# Patient Record
Sex: Female | Born: 1990
Health system: Southern US, Community
[De-identification: ages and names within clinical notes are randomized; demographics above are authoritative.]

## PROBLEM LIST (undated history)

## (undated) DIAGNOSIS — F32A Depression, unspecified: Secondary | ICD-10-CM

## (undated) DIAGNOSIS — F419 Anxiety disorder, unspecified: Secondary | ICD-10-CM

## (undated) DIAGNOSIS — F329 Major depressive disorder, single episode, unspecified: Secondary | ICD-10-CM

## (undated) DIAGNOSIS — J45909 Unspecified asthma, uncomplicated: Secondary | ICD-10-CM

## (undated) DIAGNOSIS — K219 Gastro-esophageal reflux disease without esophagitis: Secondary | ICD-10-CM

## (undated) HISTORY — DX: Depression, unspecified: F32.A

## (undated) HISTORY — DX: Anxiety disorder, unspecified: F41.9

## (undated) HISTORY — PX: NO PAST SURGERIES: SHX2092

## (undated) HISTORY — DX: Major depressive disorder, single episode, unspecified: F32.9

---

## 2012-04-18 ENCOUNTER — Encounter (HOSPITAL_COMMUNITY): Payer: Self-pay | Admitting: Emergency Medicine

## 2012-04-18 ENCOUNTER — Inpatient Hospital Stay (HOSPITAL_COMMUNITY)
Admission: EM | Admit: 2012-04-18 | Discharge: 2012-04-24 | DRG: 204 | Disposition: A | Discharge status: Home / Self Care | Payer: BC Managed Care – PPO | Attending: Internal Medicine | Admitting: Internal Medicine

## 2012-04-18 ENCOUNTER — Emergency Department (HOSPITAL_COMMUNITY): Payer: BC Managed Care – PPO

## 2012-04-18 DIAGNOSIS — K5900 Constipation, unspecified: Secondary | ICD-10-CM

## 2012-04-18 DIAGNOSIS — J45909 Unspecified asthma, uncomplicated: Secondary | ICD-10-CM

## 2012-04-18 DIAGNOSIS — M549 Dorsalgia, unspecified: Secondary | ICD-10-CM

## 2012-04-18 DIAGNOSIS — K219 Gastro-esophageal reflux disease without esophagitis: Secondary | ICD-10-CM

## 2012-04-18 DIAGNOSIS — R1013 Epigastric pain: Secondary | ICD-10-CM

## 2012-04-18 DIAGNOSIS — K859 Acute pancreatitis without necrosis or infection, unspecified: Principal | ICD-10-CM

## 2012-04-18 HISTORY — DX: Gastro-esophageal reflux disease without esophagitis: K21.9

## 2012-04-18 HISTORY — DX: Unspecified asthma, uncomplicated: J45.909

## 2012-04-18 LAB — CBC WITH DIFFERENTIAL/PLATELET
Eosinophils Relative: 0 % (ref 0–5)
HCT: 36.6 % (ref 36.0–46.0)
Hemoglobin: 12.2 g/dL (ref 12.0–15.0)
Lymphocytes Relative: 16 % (ref 12–46)
Lymphs Abs: 1.2 10*3/uL (ref 0.7–4.0)
MCV: 79.6 fL (ref 78.0–100.0)
Monocytes Absolute: 0.4 10*3/uL (ref 0.1–1.0)
Monocytes Relative: 6 % (ref 3–12)
RBC: 4.6 MIL/uL (ref 3.87–5.11)
RDW: 13 % (ref 11.5–15.5)
WBC: 7.5 10*3/uL (ref 4.0–10.5)

## 2012-04-18 LAB — URINALYSIS, MICROSCOPIC ONLY
Nitrite: NEGATIVE
Protein, ur: NEGATIVE mg/dL
Specific Gravity, Urine: 1.026 (ref 1.005–1.030)
Urobilinogen, UA: 1 mg/dL (ref 0.0–1.0)

## 2012-04-18 LAB — COMPREHENSIVE METABOLIC PANEL
BUN: 8 mg/dL (ref 6–23)
CO2: 26 mEq/L (ref 19–32)
Calcium: 10 mg/dL (ref 8.4–10.5)
Creatinine, Ser: 1.03 mg/dL (ref 0.50–1.10)
GFR calc Af Amer: >90 mL/min (ref >90)
GFR calc non Af Amer: 78 mL/min — ABNORMAL LOW (ref >90)
Glucose, Bld: 98 mg/dL (ref 70–99)
Total Bilirubin: 0.5 mg/dL (ref 0.3–1.2)

## 2012-04-18 MED ORDER — MORPHINE SULFATE 4 MG/ML IJ SOLN
4.0000 mg | Freq: Once | INTRAMUSCULAR | Status: AC
  Administered 2012-04-18: 4 mg via INTRAVENOUS
  Filled 2012-04-18: qty 1

## 2012-04-18 MED ORDER — ONDANSETRON HCL 4 MG/2ML IJ SOLN
4.0000 mg | Freq: Three times a day (TID) | INTRAMUSCULAR | Status: DC | PRN
  Administered 2012-04-19: 4 mg via INTRAVENOUS
  Filled 2012-04-18: qty 2

## 2012-04-18 MED ORDER — IOHEXOL 300 MG/ML  SOLN
100.0000 mL | Freq: Once | INTRAMUSCULAR | Status: AC | PRN
  Administered 2012-04-18: 100 mL via INTRAVENOUS

## 2012-04-18 MED ORDER — HYDROMORPHONE HCL PF 1 MG/ML IJ SOLN
1.0000 mg | INTRAMUSCULAR | Status: DC | PRN
  Administered 2012-04-19: 1 mg via INTRAVENOUS
  Filled 2012-04-18: qty 1

## 2012-04-18 MED ORDER — SODIUM CHLORIDE 0.9 % IV BOLUS (SEPSIS)
1000.0000 mL | Freq: Once | INTRAVENOUS | Status: AC
  Administered 2012-04-18: 1000 mL via INTRAVENOUS

## 2012-04-18 MED ORDER — IOHEXOL 300 MG/ML  SOLN
50.0000 mL | Freq: Once | INTRAMUSCULAR | Status: AC | PRN
  Administered 2012-04-18: 50 mL via ORAL

## 2012-04-18 MED ORDER — FENTANYL CITRATE 0.05 MG/ML IJ SOLN
50.0000 ug | Freq: Once | INTRAMUSCULAR | Status: AC
  Administered 2012-04-18: 50 ug via INTRAVENOUS
  Filled 2012-04-18: qty 2

## 2012-04-18 MED ORDER — ONDANSETRON HCL 4 MG/2ML IJ SOLN
4.0000 mg | Freq: Once | INTRAMUSCULAR | Status: AC
  Administered 2012-04-18: 4 mg via INTRAVENOUS
  Filled 2012-04-18: qty 2

## 2012-04-18 MED ORDER — DEXTROSE-NACL 5-0.45 % IV SOLN
INTRAVENOUS | Status: AC
  Administered 2012-04-18: via INTRAVENOUS

## 2012-04-18 MED ORDER — HYDROMORPHONE HCL PF 1 MG/ML IJ SOLN
1.0000 mg | Freq: Once | INTRAMUSCULAR | Status: AC
  Administered 2012-04-18: 1 mg via INTRAVENOUS
  Filled 2012-04-18: qty 1

## 2012-04-18 NOTE — ED Notes (Signed)
Patient transported to CT 

## 2012-04-18 NOTE — Progress Notes (Signed)
[facility] ED CM noted pt with coverage but no pcp listed Spoke with pt who confirms no pcp  [facility] ED CM spoke with pt on how to obtain an in network pcp with insurance coverage via the customer service number or web site   

## 2012-04-18 NOTE — ED Notes (Signed)
Pt on hold for admit to floor pending RN arrival.  AC/Charge RN aware

## 2012-04-18 NOTE — Progress Notes (Signed)
[facility] ED CM spoke with Cala Bradford Pt's mother to inform her of pt request for staff to call her Informed her EDP further decisions is based on results of pt test, pt has received pain medication Mother wants CM to have EDP or RN to "keep her posted" and to let her know if pt is being admitted so that she could travel from [place] to be with pt. CM updated ED RN and EDP

## 2012-04-18 NOTE — Progress Notes (Signed)
ED CM noted pt without a pcp listed with blue cross and blue shield coverage Spoke with pt She was noted to be in distress, tearful and stating "hurting too much" Pt grimacing, crying and touching to indicate pain under her right breast.  Pt informed CM that her mother wanted someone to call her and provided mother's number as [phone] CM updated ED RN.  RN assisting in giving pt pain medication Pt stated she does not have a pcp at this time

## 2012-04-18 NOTE — ED Provider Notes (Signed)
History     CSN: [id number]  Arrival date & time 04/18/12  1350   First MD Initiated Contact with Patient 04/18/12 1506      Chief Complaint  Patient presents with  . Abdominal Pain  . Back Pain    (Consider location/radiation/quality/duration/timing/severity/associated sxs/prior treatment) Patient is a 22 y.o. female presenting with abdominal pain and back pain. The history is provided by the patient.  Abdominal Pain Associated symptoms: nausea   Associated symptoms: no chest pain, no diarrhea, no dysuria, no shortness of breath and no vomiting   Back Pain Associated symptoms: abdominal pain   Associated symptoms: no chest pain, no dysuria, no headaches, no numbness, no pelvic pain and no weakness    patient has had dull abdominal pain since Friday. It is constant but the nature of the changes. The severity will increase and decrease. It is not changed by food. No fevers. She's had some mild nausea. No vomiting or diarrhea. Mild Constipation. She's not had pains at this before. She states the pain goes through to her back. She denies possibility of pregnancy. No hematemesis. No diarrhea or constipation. Past Medical History  Diagnosis Date  . GERD (gastroesophageal reflux disease)   . Asthma     History reviewed. No pertinent past surgical history.  No family history on file.  History  Substance Use Topics  . Smoking status: Never Smoker   . Smokeless tobacco: Not on file  . Alcohol Use: No    OB History   Grav Para Term Preterm Abortions TAB SAB Ect Mult Living                  Review of Systems  Constitutional: Negative for activity change and appetite change.  HENT: Negative for neck stiffness.   Eyes: Negative for pain.  Respiratory: Negative for chest tightness and shortness of breath.   Cardiovascular: Negative for chest pain and leg swelling.  Gastrointestinal: Positive for nausea and abdominal pain. Negative for vomiting and diarrhea.  Genitourinary:  Negative for dysuria, flank pain and pelvic pain.  Musculoskeletal: Positive for back pain.  Skin: Negative for rash.  Neurological: Negative for weakness, numbness and headaches.  Psychiatric/Behavioral: Negative for behavioral problems.    Allergies  Review of patient's allergies indicates no known allergies.  Home Medications   Current Outpatient Rx  Name  Route  Sig  Dispense  Refill  . bismuth subsalicylate (PEPTO BISMOL) 262 MG/15ML suspension   Oral   Take 30 mLs by mouth every 6 (six) hours as needed for indigestion.         Marland Kitchen esomeprazole (NEXIUM) 40 MG capsule   Oral   Take 40 mg by mouth daily before breakfast.           BP 129/74  Pulse 86  Temp(Src) 98.4 F (36.9 C) (Oral)  Resp 20  SpO2 100%  LMP 03/30/2012  Physical Exam  Nursing note and vitals reviewed. Constitutional: She is oriented to person, place, and time. She appears well-developed and well-nourished.  Patient is obese  HENT:  Head: Normocephalic and atraumatic.  Eyes: EOM are normal. Pupils are equal, round, and reactive to light.  Neck: Normal range of motion. Neck supple.  Cardiovascular: Normal rate, regular rhythm and normal heart sounds.   No murmur heard. Pulmonary/Chest: Effort normal and breath sounds normal. No respiratory distress. She has no wheezes. She has no rales.  Abdominal: Soft. Bowel sounds are normal. She exhibits no distension. There is tenderness. There is no  rebound and no guarding.  Epigastric tenderness without rebound or guarding.  Musculoskeletal: Normal range of motion.  Neurological: She is alert and oriented to person, place, and time. No cranial nerve deficit.  Skin: Skin is warm and dry.  Psychiatric: She has a normal mood and affect. Her speech is normal.    ED Course  Procedures (including critical care time)  Labs Reviewed  CBC WITH DIFFERENTIAL - Abnormal; Notable for the following:    Neutrophils Relative 78 (*)    All other components within  normal limits  COMPREHENSIVE METABOLIC PANEL - Abnormal; Notable for the following:    GFR calc non Af Amer 78 (*)    All other components within normal limits  URINALYSIS, MICROSCOPIC ONLY - Abnormal; Notable for the following:    APPearance CLOUDY (*)    Bilirubin Urine SMALL (*)    Ketones, ur 40 (*)    All other components within normal limits  LIPASE, BLOOD - Abnormal; Notable for the following:    Lipase 67 (*)    All other components within normal limits  POCT PREGNANCY, URINE   US Abdomen Complete  04/18/2012  *RADIOLOGY REPORT*  Clinical Data:  Abdominal pain.  COMPLETE ABDOMINAL ULTRASOUND  Comparison:  None.  Findings:  Gallbladder:  No shadowing gallstones or echogenic sludge.  No gallbladder wall thickening or pericholecystic fluid.  No sonographic Murphy's sign according to the ultrasound technologist. Wall thickness is 1.9 mm, within normal limits.  Common bile duct:  Normal in caliber. No biliary ductal dilation. The maximal diameter is 4.3 mm, within normal limits.  Liver:  No focal lesion identified.  Within normal limits in parenchymal echogenicity.  IVC:  Appears normal.  Pancreas:  No focal abnormality seen.  Spleen:  Normal size and echotexture without focal parenchymal abnormality.  The maximal length is 7.8 cm, within normal limits.  Right Kidney:  No hydronephrosis.  Well-preserved cortex.  Normal size and parenchymal echotexture without focal abnormalities. The maximal length is 10.9 cm, within normal limits.  Left Kidney:  No hydronephrosis.  Well-preserved cortex.  Normal size and parenchymal echotexture without focal abnormalities. The maximal length is 9.6 cm, within normal limits.  Abdominal aorta:  No aneurysm identified.  IMPRESSION: Negative abdominal ultrasound.   Original Report Authenticated By: Marin Roberts, M.D.    Ct Abdomen Pelvis W Contrast  04/18/2012  *RADIOLOGY REPORT*  Clinical Data: 22 year old female with abdominal and pelvic pain.  CT ABDOMEN  AND PELVIS WITH CONTRAST  Technique:  Multidetector CT imaging of the abdomen and pelvis was performed following the standard protocol during bolus administration of intravenous contrast.  Contrast: 100 ml intravenous Omnipaque-300  Comparison: None  Findings: There is mild peripancreatic inflammation compatible with pancreatitis.  There is no evidence of pancreatic necrosis, acute collections, hemorrhage, or venous thrombosis.  The liver, spleen, kidneys, gallbladder and adrenal glands are unremarkable. There is no evidence of biliary dilatation or definite choledocholithiasis.  A trace amount of free fluid within the pelvis is noted. There is no evidence of enlarged lymph nodes or abdominal aortic aneurysm.  The bowel, appendix and bladder are unremarkable.  No acute or suspicious bony abnormalities are present.  IMPRESSION: Pancreatitis without complicating features.  Trace free pelvic fluid - question physiologic or related to pancreatitis.   Original Report Authenticated By: Harmon Pier, M.D.      1. Pancreatitis       MDM  Patient with abdominal pain and nausea. Pain radiates to back. Found to have a pancreatitis  on CT. Laboratories otherwise reassuring. Patient does not hepatobiliary disease. She does not drink alcohol. She's had continued pain that has required IV narcotics. She'll be admitted to medicine for further evaluation and treatment. After discussion with Dr. Starr Sinclair she will be admitted as an inpatient.        Juliet Rude. Rubin Payor, MD 04/18/12 2109

## 2012-04-19 ENCOUNTER — Inpatient Hospital Stay (HOSPITAL_COMMUNITY): Payer: BC Managed Care – PPO

## 2012-04-19 DIAGNOSIS — K219 Gastro-esophageal reflux disease without esophagitis: Secondary | ICD-10-CM | POA: Diagnosis present

## 2012-04-19 DIAGNOSIS — J45909 Unspecified asthma, uncomplicated: Secondary | ICD-10-CM | POA: Insufficient documentation

## 2012-04-19 DIAGNOSIS — M549 Dorsalgia, unspecified: Secondary | ICD-10-CM

## 2012-04-19 DIAGNOSIS — R1013 Epigastric pain: Secondary | ICD-10-CM

## 2012-04-19 DIAGNOSIS — K859 Acute pancreatitis without necrosis or infection, unspecified: Secondary | ICD-10-CM | POA: Diagnosis present

## 2012-04-19 LAB — LIPID PANEL
Cholesterol: 155 mg/dL (ref 0–200)
LDL Cholesterol: 105 mg/dL — ABNORMAL HIGH (ref 0–99)
Triglycerides: 48 mg/dL (ref <150)

## 2012-04-19 LAB — RAPID URINE DRUG SCREEN, HOSP PERFORMED
Amphetamines: NOT DETECTED
Barbiturates: NOT DETECTED
Benzodiazepines: NOT DETECTED
Cocaine: NOT DETECTED

## 2012-04-19 LAB — BASIC METABOLIC PANEL
Chloride: 102 mEq/L (ref 96–112)
GFR calc Af Amer: >90 mL/min (ref >90)
GFR calc non Af Amer: 86 mL/min — ABNORMAL LOW (ref >90)
Potassium: 4.1 mEq/L (ref 3.5–5.1)

## 2012-04-19 LAB — PHOSPHORUS: Phosphorus: 4.7 mg/dL — ABNORMAL HIGH (ref 2.3–4.6)

## 2012-04-19 LAB — RHEUMATOID FACTOR: Rhuematoid fact SerPl-aCnc: <10 IU/mL (ref <=14)

## 2012-04-19 LAB — TSH: TSH: 1.962 u[IU]/mL (ref 0.350–4.500)

## 2012-04-19 MED ORDER — MORPHINE SULFATE 2 MG/ML IJ SOLN
1.0000 mg | INTRAMUSCULAR | Status: DC | PRN
  Administered 2012-04-19 (×2): 2 mg via INTRAVENOUS
  Filled 2012-04-19 (×2): qty 1

## 2012-04-19 MED ORDER — ONDANSETRON HCL 4 MG/2ML IJ SOLN
4.0000 mg | Freq: Four times a day (QID) | INTRAMUSCULAR | Status: DC | PRN
  Administered 2012-04-19 – 2012-04-23 (×7): 4 mg via INTRAVENOUS
  Filled 2012-04-19 (×8): qty 2

## 2012-04-19 MED ORDER — TIZANIDINE HCL 2 MG PO TABS
2.0000 mg | ORAL_TABLET | Freq: Every day | ORAL | Status: DC
  Administered 2012-04-19: 2 mg via ORAL
  Filled 2012-04-19 (×2): qty 1

## 2012-04-19 MED ORDER — PROMETHAZINE HCL 25 MG/ML IJ SOLN
12.5000 mg | Freq: Once | INTRAMUSCULAR | Status: AC
  Administered 2012-04-19: 12.5 mg via INTRAVENOUS
  Filled 2012-04-19: qty 1

## 2012-04-19 MED ORDER — SODIUM CHLORIDE 0.9 % IV SOLN
INTRAVENOUS | Status: DC
  Administered 2012-04-19 – 2012-04-24 (×18): via INTRAVENOUS

## 2012-04-19 MED ORDER — ACETAMINOPHEN 650 MG RE SUPP: 650.0000 mg | Freq: Four times a day (QID) | RECTAL | Status: DC | PRN

## 2012-04-19 MED ORDER — OXYCODONE HCL 5 MG PO TABS
5.0000 mg | ORAL_TABLET | ORAL | Status: DC | PRN
  Administered 2012-04-19 – 2012-04-20 (×3): 5 mg via ORAL
  Filled 2012-04-19 (×3): qty 1

## 2012-04-19 MED ORDER — ACETAMINOPHEN 325 MG PO TABS: 650.0000 mg | ORAL_TABLET | Freq: Four times a day (QID) | ORAL | Status: DC | PRN

## 2012-04-19 MED ORDER — ONDANSETRON HCL 4 MG PO TABS: 4.0000 mg | ORAL_TABLET | Freq: Four times a day (QID) | ORAL | Status: DC | PRN

## 2012-04-19 MED ORDER — HYDROMORPHONE HCL PF 1 MG/ML IJ SOLN
1.0000 mg | INTRAMUSCULAR | Status: DC | PRN
  Administered 2012-04-19 – 2012-04-20 (×6): 1 mg via INTRAVENOUS
  Filled 2012-04-19 (×7): qty 1

## 2012-04-19 MED ORDER — MORPHINE SULFATE 4 MG/ML IJ SOLN
4.0000 mg | INTRAMUSCULAR | Status: DC | PRN
  Administered 2012-04-19: 4 mg via INTRAVENOUS
  Filled 2012-04-19: qty 1

## 2012-04-19 MED ORDER — PANTOPRAZOLE SODIUM 40 MG PO TBEC
40.0000 mg | DELAYED_RELEASE_TABLET | Freq: Two times a day (BID) | ORAL | Status: DC
  Administered 2012-04-19 – 2012-04-20 (×3): 40 mg via ORAL
  Filled 2012-04-19 (×6): qty 1

## 2012-04-19 NOTE — Care Management Note (Addendum)
    Page 1 of 1   04/25/2012     10:49:00 AM   CARE MANAGEMENT NOTE 04/25/2012  Patient:  Laura Greene, Laura Greene   Account Number:  [id number]  Date Initiated:  04/19/2012  Documentation initiated by:  Lanier Clam  Subjective/Objective Assessment:   ADMITTED W/ABD PAIN.ACUTE PANCREATITIS.     Action/Plan:   SUTDENT @ [employer]-LIVES IN [place].HAS MOTHER SUPPORT.HAS PCP IN [place].HAS PHARMACY.   Anticipated DC Date:  04/26/2012   Anticipated DC Plan:  HOME/SELF CARE  In-house referral  PCP / [facility]      DC Planning Services  CM consult      Choice offered to / List presented to:             Status of service:  Completed, signed off Medicare Important Message given?   (If response is "NO", the following Medicare IM given date fields will be blank) Date Medicare IM given:   Date Additional Medicare IM given:    Discharge Disposition:  HOME/SELF CARE  Per UR Regulation:  Reviewed for med. necessity/level of care/duration of stay  If discussed at Long Length of Stay Meetings, dates discussed:    Comments:  04/19/12 KATHY MAHABIR RN,BSN NCM [phone] PATIENT WOULD LIKE A PCP IN [place].PROVIDED PATIENT W/PCP LISTING-ENCOURAGED PHYSICIAN REFERRAL SERVICE TEL# FOR USE ONLY WHILE IN HOSPITAL,& OTHER PCP LISTING.HAS PRESCRIPTION COVERAGE & EXPLAINED SHE IS OBLIGATED TO HER CO-PAY.SHE USES [facility].SHE WILL DISCUSS INFO RECEIVED W/HER MOTHER.NO FURTHER D/C NEEDS.

## 2012-04-19 NOTE — H&P (Signed)
[facility] History and Physical  Laura Greene ZOX:[id number] DOB: [birth year] DOA: 04/18/2012  Referring physician: Dr. Rubin Payor PCP: No primary provider on file.  Specialists:   Chief Complaint:   HPI: Laura Greene is a 22 y.o. female with history of GERD who presents with complaints of worsening abdominal pain x5 days- mostly in left upper quadrant and epigastrium. She reports that the pain also radiates to her back. She admits to nausea, but denies vomiting. She also denies alcohol, no drug use and states she does not take any supplements. She was seen in the ED and abdominal ultrasound was done and came back negative, her lipase came back mildly elevated at 67, a CT scan of her abdomen and pelvis was done and showed findings consistent with pancreatitis without complicating features. She is admitted further evaluation and management. She denies any recent viral illnesses and on no meds that associated with cause of pancreatitis   Review of Systems: The patient denies anorexia, fever, weight loss,, vision loss, decreased hearing, hoarseness, chest pain, syncope, dyspnea on exertion, peripheral edema, balance deficits, hemoptysis,  melena, hematochezia, hematuria, incontinence, genital sores, muscle weakness, suspicious skin lesions, transient blindness, difficulty walking, depression, unusual weight change, abnormal bleeding, enlarged lymph nodes, angioedema, and breast masses.    Past Medical History  Diagnosis Date  . GERD (gastroesophageal reflux disease)   . Asthma    Past Surgical History  Procedure Laterality Date  . No past surgeries     Social History:  reports that she has never smoked. She has never used smokeless tobacco. She reports that she does not drink alcohol or use illicit drugs.  where does patient live-home   No Known Allergies  Family history-her grandmother had heart disease   Prior to Admission medications   Medication Sig Start Date End Date  Taking? Authorizing Provider  bismuth subsalicylate (PEPTO BISMOL) 262 MG/15ML suspension Take 30 mLs by mouth every 6 (six) hours as needed for indigestion.   Yes Historical Provider, MD  esomeprazole (NEXIUM) 40 MG capsule Take 40 mg by mouth daily before breakfast.   Yes Historical Provider, MD   Physical Exam: Filed Vitals:   04/18/12 1707 04/18/12 1918 04/18/12 2321 04/18/12 2351  BP: 112/60 129/74 115/64 105/68  Pulse: 75 86 95 99  Temp: 98.1 F (36.7 C) 98.4 F (36.9 C)  98 F (36.7 C)  TempSrc: Oral Oral  Oral  Resp: 16 20 18 18   Height:    5\' 4"  (1.626 m)  Weight:    98.1 kg (216 lb 4.3 oz)  SpO2: 99% 100%  96%    Constitutional: Vital signs reviewed.  Patient is a well-developed and well-nourished  in no acute distress and cooperative with exam. Alert and oriented x3.  Head: Normocephalic and atraumatic Mouth: no erythema or exudates, MMM Eyes: PERRL, EOMI, conjunctivae normal, No scleral icterus.  Neck: Supple, Trachea midline normal ROM, No JVD, mass, thyromegaly, or carotid bruit present.  Cardiovascular: RRR, S1 normal, S2 normal, no MRG, pulses symmetric and intact bilaterally Pulmonary/Chest: CTAB, no wheezes, rales, or rhonchi Abdominal: Soft. Epigastric and left upper quadrant tenderness, no rebound, non-distended, bowel sounds are normal, no masses, organomegaly, or guarding present. Extremities: No cyanosis and no edema  Neurological: A&O x3, Strength is normal and symmetric bilaterally, cranial nerve II-XII are grossly intact, no focal motor deficit, sensory intact to light touch bilaterally.  Skin: Warm, dry and intact. No rash, cyanosis, or clubbing.  Psychiatric: Normal mood and affect. speech and behavior is normal.  Judgment and thought content normal. Cognition and memory are normal.     Labs on Admission:  Basic Metabolic Panel:  Recent Labs Lab 04/18/12 1430  NA 135  K 4.1  CL 98  CO2 26  GLUCOSE 98  BUN 8  CREATININE 1.03  CALCIUM 10.0    Liver Function Tests:  Recent Labs Lab 04/18/12 1430  AST 12  ALT 8  ALKPHOS 57  BILITOT 0.5  PROT 7.8  ALBUMIN 3.9    Recent Labs Lab 04/18/12 1430  LIPASE 67*   No results found for this basename: AMMONIA,  in the last 168 hours CBC:  Recent Labs Lab 04/18/12 1430  WBC 7.5  NEUTROABS 5.8  HGB 12.2  HCT 36.6  MCV 79.6  PLT 296   Cardiac Enzymes: No results found for this basename: CKTOTAL, CKMB, CKMBINDEX, TROPONINI,  in the last 168 hours  BNP (last 3 results) No results found for this basename: PROBNP,  in the last 8760 hours CBG: No results found for this basename: GLUCAP,  in the last 168 hours  Radiological Exams on Admission: US Abdomen Complete  04/18/2012  *RADIOLOGY REPORT*  Clinical Data:  Abdominal pain.  COMPLETE ABDOMINAL ULTRASOUND  Comparison:  None.  Findings:  Gallbladder:  No shadowing gallstones or echogenic sludge.  No gallbladder wall thickening or pericholecystic fluid.  No sonographic Murphy's sign according to the ultrasound technologist. Wall thickness is 1.9 mm, within normal limits.  Common bile duct:  Normal in caliber. No biliary ductal dilation. The maximal diameter is 4.3 mm, within normal limits.  Liver:  No focal lesion identified.  Within normal limits in parenchymal echogenicity.  IVC:  Appears normal.  Pancreas:  No focal abnormality seen.  Spleen:  Normal size and echotexture without focal parenchymal abnormality.  The maximal length is 7.8 cm, within normal limits.  Right Kidney:  No hydronephrosis.  Well-preserved cortex.  Normal size and parenchymal echotexture without focal abnormalities. The maximal length is 10.9 cm, within normal limits.  Left Kidney:  No hydronephrosis.  Well-preserved cortex.  Normal size and parenchymal echotexture without focal abnormalities. The maximal length is 9.6 cm, within normal limits.  Abdominal aorta:  No aneurysm identified.  IMPRESSION: Negative abdominal ultrasound.   Original Report  Authenticated By: Marin Roberts, M.D.    Ct Abdomen Pelvis W Contrast  04/18/2012  *RADIOLOGY REPORT*  Clinical Data: 22 year old female with abdominal and pelvic pain.  CT ABDOMEN AND PELVIS WITH CONTRAST  Technique:  Multidetector CT imaging of the abdomen and pelvis was performed following the standard protocol during bolus administration of intravenous contrast.  Contrast: 100 ml intravenous Omnipaque-300  Comparison: None  Findings: There is mild peripancreatic inflammation compatible with pancreatitis.  There is no evidence of pancreatic necrosis, acute collections, hemorrhage, or venous thrombosis.  The liver, spleen, kidneys, gallbladder and adrenal glands are unremarkable. There is no evidence of biliary dilatation or definite choledocholithiasis.  A trace amount of free fluid within the pelvis is noted. There is no evidence of enlarged lymph nodes or abdominal aortic aneurysm.  The bowel, appendix and bladder are unremarkable.  No acute or suspicious bony abnormalities are present.  IMPRESSION: Pancreatitis without complicating features.  Trace free pelvic fluid - question physiologic or related to pancreatitis.   Original Report Authenticated By: Harmon Pier, M.D.       Assessment/Plan Active Problems:   Pancreatitis, acute -As discussed above, unclear etiology. Patient denies alcohol use and ultrasound negative for gallstones -Will obtain fasting lipid panel-eval triglycerides,  also obtain a phosphorus, follow -further recommendations pending studies -Conservative management-IV fluids, start clears, pain management and follow   GERD (gastroesophageal reflux disease) -Place on PPI  Code Status: full Family Communication: Patient alone at bedside Disposition Plan: Admit to medical floor, to home when medically stable Time spent: >  Kela Millin [facility] Pager [phone] Time spent -  If 7PM-7AM, please contact night-coverage [email or web address] Password  [facility] 04/19/2012, 12:46 AM

## 2012-04-19 NOTE — Progress Notes (Signed)
[facility] PROGRESS NOTE  Laura Greene ZOX:[id number] DOB: January 19, 1991 DOA: 04/18/2012 PCP: No primary provider on file.  Assessment/Plan: Acute pancreatitis -Low suspicion for medication induced -Only new medication was ibuprofen -Suspect idiopathic versus viral versus autoimmune -Patient had flulike symptoms approximately 2 weeks prior to admission -Check HIV, ANA, ANCA, rheumatoid factor -Continue IV fluids, bowel rest -Urine drug screen -Lipase 67 the time of admission -Abdominal ultrasound unremarkable -TSH Gastroesophageal reflux disease -Continue proton pump inhibitor Back pain -Plain films of the thoracic and lumbar spine -Likely related to her pancreatitis    Family Communication:   Pt at beside Disposition Plan:   Home when medically stable -Total time      Procedures/Studies: US Abdomen Complete  04/18/2012  *RADIOLOGY REPORT*  Clinical Data:  Abdominal pain.  COMPLETE ABDOMINAL ULTRASOUND  Comparison:  None.  Findings:  Gallbladder:  No shadowing gallstones or echogenic sludge.  No gallbladder wall thickening or pericholecystic fluid.  No sonographic Murphy's sign according to the ultrasound technologist. Wall thickness is 1.9 mm, within normal limits.  Common bile duct:  Normal in caliber. No biliary ductal dilation. The maximal diameter is 4.3 mm, within normal limits.  Liver:  No focal lesion identified.  Within normal limits in parenchymal echogenicity.  IVC:  Appears normal.  Pancreas:  No focal abnormality seen.  Spleen:  Normal size and echotexture without focal parenchymal abnormality.  The maximal length is 7.8 cm, within normal limits.  Right Kidney:  No hydronephrosis.  Well-preserved cortex.  Normal size and parenchymal echotexture without focal abnormalities. The maximal length is 10.9 cm, within normal limits.  Left Kidney:  No hydronephrosis.  Well-preserved cortex.  Normal size and parenchymal echotexture without focal abnormalities. The  maximal length is 9.6 cm, within normal limits.  Abdominal aorta:  No aneurysm identified.  IMPRESSION: Negative abdominal ultrasound.   Original Report Authenticated By: Marin Roberts, M.D.    Ct Abdomen Pelvis W Contrast  04/18/2012  *RADIOLOGY REPORT*  Clinical Data: 22 year old female with abdominal and pelvic pain.  CT ABDOMEN AND PELVIS WITH CONTRAST  Technique:  Multidetector CT imaging of the abdomen and pelvis was performed following the standard protocol during bolus administration of intravenous contrast.  Contrast: 100 ml intravenous Omnipaque-300  Comparison: None  Findings: There is mild peripancreatic inflammation compatible with pancreatitis.  There is no evidence of pancreatic necrosis, acute collections, hemorrhage, or venous thrombosis.  The liver, spleen, kidneys, gallbladder and adrenal glands are unremarkable. There is no evidence of biliary dilatation or definite choledocholithiasis.  A trace amount of free fluid within the pelvis is noted. There is no evidence of enlarged lymph nodes or abdominal aortic aneurysm.  The bowel, appendix and bladder are unremarkable.  No acute or suspicious bony abnormalities are present.  IMPRESSION: Pancreatitis without complicating features.  Trace free pelvic fluid - question physiologic or related to pancreatitis.   Original Report Authenticated By: Harmon Pier, M.D.          Subjective:  Patient complains of back pain that is worse than her abdominal pain at this point. However her abdominal pain and back pain are better than yesterday. Denies any fevers, chills, chest pain, shortness breath, dysuria, hematuria, rashes. Had flulike symptoms also 2 weeks prior to this admission.  Objective: Filed Vitals:   04/18/12 1918 04/18/12 2321 04/18/12 2351 04/19/12 0641  BP: 129/74 115/64 105/68 105/70  Pulse: 86 95 99 80  Temp: 98.4 F (36.9 C)  98 F (36.7 C) 98 F (36.7 C)  TempSrc:  Oral  Oral Oral  Resp: 20 18 18 18   Height:   5\' 4"   (1.626 m)   Weight:   98.1 kg (216 lb 4.3 oz)   SpO2: 100%  96% 100%    Intake/Output Summary (Last 24 hours) at 04/19/12 0826 Last data filed at 04/19/12 0507  Gross per 24 hour  Intake 2229.16 ml  Output    651 ml  Net 1578.16 ml   Weight change:  Exam:   General:  Pt is alert, follows commands appropriately, not in acute distress  HEENT: No icterus, No thrush, No neck mass, /AT  Cardiovascular: RRR, S1/S2, no rubs, no gallops  Respiratory: CTA bilaterally, no wheezing, no crackles, no rhonchi  Abdomen: Soft/+BS, nontender to palpation epigastric region without any rebound, non distended, no guarding  Extremities: No edema, No lymphangitis, No petechiae, No rashes, no synovitis; mild tenderness to palpation in the lower thoracic and upper thoracic spine area--no rashes or induration   Data Reviewed: Basic Metabolic Panel:  Recent Labs Lab 04/18/12 1430 04/19/12 0542  NA 135 136  K 4.1 4.1  CL 98 102  CO2 26 24  GLUCOSE 98 95  BUN 8 8  CREATININE 1.03 0.95  CALCIUM 10.0 9.0  PHOS  --  4.7*   Liver Function Tests:  Recent Labs Lab 04/18/12 1430  AST 12  ALT 8  ALKPHOS 57  BILITOT 0.5  PROT 7.8  ALBUMIN 3.9    Recent Labs Lab 04/18/12 1430 04/19/12 0542  LIPASE 67* 58   No results found for this basename: AMMONIA,  in the last 168 hours CBC:  Recent Labs Lab 04/18/12 1430  WBC 7.5  NEUTROABS 5.8  HGB 12.2  HCT 36.6  MCV 79.6  PLT 296   Cardiac Enzymes: No results found for this basename: CKTOTAL, CKMB, CKMBINDEX, TROPONINI,  in the last 168 hours BNP: No components found with this basename: POCBNP,  CBG: No results found for this basename: GLUCAP,  in the last 168 hours  No results found for this or any previous visit (from the past 240 hour(s)).   Scheduled Meds: . dextrose 5 % and 0.45% NaCl   Intravenous STAT  . pantoprazole  40 mg Oral BID AC   Continuous Infusions: . sodium chloride 150 mL/hr at 04/19/12 0224      Wylder Macomber, DO  [facility] Pager [phone]  If 7PM-7AM, please contact night-coverage [email or web address] Password [email or web address] 04/19/2012, 8:26 AM   LOS: 1 day

## 2012-04-20 LAB — BASIC METABOLIC PANEL
CO2: 24 mEq/L (ref 19–32)
Chloride: 102 mEq/L (ref 96–112)
Creatinine, Ser: 1.04 mg/dL (ref 0.50–1.10)
GFR calc Af Amer: 90 mL/min — ABNORMAL LOW (ref >90)
Potassium: 3.4 mEq/L — ABNORMAL LOW (ref 3.5–5.1)
Sodium: 135 mEq/L (ref 135–145)

## 2012-04-20 LAB — CBC
MCV: 80.4 fL (ref 78.0–100.0)
Platelets: 257 10*3/uL (ref 150–400)
RBC: 4.04 MIL/uL (ref 3.87–5.11)
WBC: 6.3 10*3/uL (ref 4.0–10.5)

## 2012-04-20 LAB — ANA: Anti Nuclear Antibody(ANA): NEGATIVE

## 2012-04-20 MED ORDER — PANTOPRAZOLE SODIUM 40 MG IV SOLR
40.0000 mg | INTRAVENOUS | Status: DC
  Administered 2012-04-21 – 2012-04-24 (×4): 40 mg via INTRAVENOUS
  Filled 2012-04-20 (×5): qty 40

## 2012-04-20 MED ORDER — POTASSIUM CHLORIDE 10 MEQ/100ML IV SOLN
10.0000 meq | INTRAVENOUS | Status: AC
  Administered 2012-04-20 (×2): 10 meq via INTRAVENOUS
  Filled 2012-04-20 (×2): qty 100

## 2012-04-20 MED ORDER — LIP MEDEX EX OINT
TOPICAL_OINTMENT | CUTANEOUS | Status: DC | PRN
  Administered 2012-04-20 – 2012-04-22 (×2): via TOPICAL
  Filled 2012-04-20 (×2): qty 7

## 2012-04-20 MED ORDER — HYDROMORPHONE HCL PF 1 MG/ML IJ SOLN
1.0000 mg | INTRAMUSCULAR | Status: DC | PRN
  Administered 2012-04-20 – 2012-04-24 (×33): 1 mg via INTRAVENOUS
  Filled 2012-04-20 (×33): qty 1

## 2012-04-20 NOTE — Progress Notes (Signed)
[facility] PROGRESS NOTE  Jolean Madariaga BJY:[id number] DOB: 09/06/1990 DOA: 04/18/2012 PCP: No primary provider on file.  Assessment/Plan: Acute pancreatitis  -Patient only asked for Dilaudid 5 times in approximately last 24 hours -Patient's pain appears to be compounded by her anxiety -The patient n.p.o. -Continue Dilaudid 1 mg and she feels that her pain is really 50% when she is given the medication -Change hydromorphone to every 2 hours -Low suspicion for medication induced  -Only new medication was ibuprofen  -Suspect idiopathic versus viral versus autoimmune  -Patient had flulike symptoms approximately 2 weeks prior to admission  -Check HIV, ANA, ANCA, rheumatoid factor--all negative  -Continue IV fluids, bowel rest  -Urine drug screen--positve opiate -Lipase 67 the time of admission  -Abdominal ultrasound unremarkable  -TSH--1.962 Gastroesophageal reflux disease  -Continue proton pump inhibitor  Back pain  -Plain films of the thoracic and lumbar spine--neg -Likely related to her pancreatitis  Family Communication: updated mother  Disposition Plan: Home when medically stable       Procedures/Studies: Dg Thoracic Spine 2 View  04/19/2012  *RADIOLOGY REPORT*  Clinical Data: Back pain  THORACIC SPINE - 2 VIEW  Comparison:  None.  Findings:  There is no evidence of thoracic spine fracture. Alignment is normal.  No other significant bone abnormalities are identified.  IMPRESSION: Negative.   Original Report Authenticated By: Janeece Riggers, M.D.    Dg Lumbar Spine 2-3 Views  04/19/2012  *RADIOLOGY REPORT*  Clinical Data: Back pain  LUMBAR SPINE - 2-3 VIEW  Comparison: CT 04/18/2012  Findings: Negative for fracture or mass.  Normal alignment. Negative for pars defect.  No significant degenerative change.  Contrast in the colon from recent CT.  IMPRESSION: Negative   Original Report Authenticated By: Janeece Riggers, M.D.    US Abdomen Complete  04/18/2012  *RADIOLOGY  REPORT*  Clinical Data:  Abdominal pain.  COMPLETE ABDOMINAL ULTRASOUND  Comparison:  None.  Findings:  Gallbladder:  No shadowing gallstones or echogenic sludge.  No gallbladder wall thickening or pericholecystic fluid.  No sonographic Murphy's sign according to the ultrasound technologist. Wall thickness is 1.9 mm, within normal limits.  Common bile duct:  Normal in caliber. No biliary ductal dilation. The maximal diameter is 4.3 mm, within normal limits.  Liver:  No focal lesion identified.  Within normal limits in parenchymal echogenicity.  IVC:  Appears normal.  Pancreas:  No focal abnormality seen.  Spleen:  Normal size and echotexture without focal parenchymal abnormality.  The maximal length is 7.8 cm, within normal limits.  Right Kidney:  No hydronephrosis.  Well-preserved cortex.  Normal size and parenchymal echotexture without focal abnormalities. The maximal length is 10.9 cm, within normal limits.  Left Kidney:  No hydronephrosis.  Well-preserved cortex.  Normal size and parenchymal echotexture without focal abnormalities. The maximal length is 9.6 cm, within normal limits.  Abdominal aorta:  No aneurysm identified.  IMPRESSION: Negative abdominal ultrasound.   Original Report Authenticated By: Marin Roberts, M.D.    Ct Abdomen Pelvis W Contrast  04/18/2012  *RADIOLOGY REPORT*  Clinical Data: 22 year old female with abdominal and pelvic pain.  CT ABDOMEN AND PELVIS WITH CONTRAST  Technique:  Multidetector CT imaging of the abdomen and pelvis was performed following the standard protocol during bolus administration of intravenous contrast.  Contrast: 100 ml intravenous Omnipaque-300  Comparison: None  Findings: There is mild peripancreatic inflammation compatible with pancreatitis.  There is no evidence of pancreatic necrosis, acute collections, hemorrhage, or venous thrombosis.  The liver, spleen, kidneys, gallbladder  and adrenal glands are unremarkable. There is no evidence of biliary  dilatation or definite choledocholithiasis.  A trace amount of free fluid within the pelvis is noted. There is no evidence of enlarged lymph nodes or abdominal aortic aneurysm.  The bowel, appendix and bladder are unremarkable.  No acute or suspicious bony abnormalities are present.  IMPRESSION: Pancreatitis without complicating features.  Trace free pelvic fluid - question physiologic or related to pancreatitis.   Original Report Authenticated By: Harmon Pier, M.D.          Subjective: Patient struggling with whether to drink fluids or not because it causes some abdominal discomfort. As a result, she is delaying her dosing of hydromorphone. No vomiting. No fevers, chills, chest pain, shortness breath, dysuria, hematuria. Still continues to complain of back pain although a little bit better.  Objective: Filed Vitals:   04/19/12 1400 04/19/12 2113 04/20/12 0548 04/20/12 1402  BP: 116/64 112/73 110/70 130/64  Pulse: 91 86 88 77  Temp: 98.6 F (37 C) 98 F (36.7 C) 97.8 F (36.6 C) 98.8 F (37.1 C)  TempSrc: Oral Oral Oral Oral  Resp: 18 16 16 18   Height:      Weight:      SpO2: 100% 100% 96% 97%    Intake/Output Summary (Last 24 hours) at 04/20/12 1716 Last data filed at 04/20/12 1512  Gross per 24 hour  Intake   3155 ml  Output   3675 ml  Net   -520 ml   Weight change:  Exam:   General:  Pt is alert, follows commands appropriately, not in acute distress  HEENT: No icterus, No thrush,[place]/AT  Cardiovascular: RRR, S1/S2, no rubs, no gallops  Respiratory: CTA bilaterally, no wheezing, no crackles, no rhonchi  Abdomen: Soft/+BS, epigastric tender without any guarding or peritoneal sign, non distended, no guarding  Extremities: No edema, No lymphangitis, No petechiae, No rashes, no synovitis  Data Reviewed: Basic Metabolic Panel:  Recent Labs Lab 04/18/12 1430 04/19/12 0542 04/20/12 0425  NA 135 136 135  K 4.1 4.1 3.4*  CL 98 102 102  CO2 26 24 24   GLUCOSE 98 95  75  BUN 8 8 7   CREATININE 1.03 0.95 1.04  CALCIUM 10.0 9.0 8.7  PHOS  --  4.7*  --    Liver Function Tests:  Recent Labs Lab 04/18/12 1430  AST 12  ALT 8  ALKPHOS 57  BILITOT 0.5  PROT 7.8  ALBUMIN 3.9    Recent Labs Lab 04/18/12 1430 04/19/12 0542  LIPASE 67* 58   No results found for this basename: AMMONIA,  in the last 168 hours CBC:  Recent Labs Lab 04/18/12 1430 04/20/12 0425  WBC 7.5 6.3  NEUTROABS 5.8  --   HGB 12.2 10.7*  HCT 36.6 32.5*  MCV 79.6 80.4  PLT 296 257   Cardiac Enzymes: No results found for this basename: CKTOTAL, CKMB, CKMBINDEX, TROPONINI,  in the last 168 hours BNP: No components found with this basename: POCBNP,  CBG: No results found for this basename: GLUCAP,  in the last 168 hours  No results found for this or any previous visit (from the past 240 hour(s)).   Scheduled Meds: . pantoprazole (PROTONIX) IV  40 mg Intravenous Q24H  . potassium chloride  10 mEq Intravenous Q1 Hr x 2   Continuous Infusions: . sodium chloride 150 mL/hr at 04/20/12 1343     Vashaun Osmon, DO  [facility] Pager [phone]  If 7PM-7AM, please contact night-coverage [email or web address]  Password [email or web address] 04/20/2012, 5:16 PM   LOS: 2 days

## 2012-04-21 LAB — BASIC METABOLIC PANEL
Calcium: 9.1 mg/dL (ref 8.4–10.5)
Creatinine, Ser: 0.94 mg/dL (ref 0.50–1.10)
GFR calc non Af Amer: 87 mL/min — ABNORMAL LOW (ref >90)
Glucose, Bld: 85 mg/dL (ref 70–99)
Sodium: 136 mEq/L (ref 135–145)

## 2012-04-21 LAB — CBC
MCH: 26.7 pg (ref 26.0–34.0)
Platelets: 247 10*3/uL (ref 150–400)
RBC: 4.04 MIL/uL (ref 3.87–5.11)
RDW: 12.8 % (ref 11.5–15.5)
WBC: 6.1 10*3/uL (ref 4.0–10.5)

## 2012-04-21 LAB — MAGNESIUM: Magnesium: 1.7 mg/dL (ref 1.5–2.5)

## 2012-04-21 MED ORDER — VITAMINS A & D EX OINT
TOPICAL_OINTMENT | CUTANEOUS | Status: AC
  Filled 2012-04-21: qty 5

## 2012-04-21 NOTE — Progress Notes (Signed)
[facility] PROGRESS NOTE  Makari Sanko UJW:[id number] DOB: 02/04/1990 DOA: 04/18/2012 PCP: No primary provider on file.  Assessment/Plan: Acute pancreatitis  -Patient has improved with more frequent dosing of hydromorphone -Start clear liquid diet -Continue Dilaudid 1 mg and she feels that her pain is really 50% when she is given the medication  -Change hydromorphone to every 2 hours  -Low suspicion for medication induced  -Only new medication was ibuprofen  -Suspect idiopathic versus viral versus autoimmune  -Patient had flulike symptoms approximately 2 weeks prior to admission  -Check HIV, ANA, ANCA, rheumatoid factor--all negative  -Continue IV fluids, bowel rest  -Urine drug screen--positve opiate  -Lipase 67 the time of admission  -Abdominal ultrasound unremarkable  -TSH--1.962  Gastroesophageal reflux disease  -Continue proton pump inhibitor  Back pain  -improving -Plain films of the thoracic and lumbar spine--neg  -Likely related to her pancreatitis  Family Communication: updated mother  Disposition Plan: Home when medically stable        Procedures/Studies: Dg Thoracic Spine 2 View  04/19/2012  *RADIOLOGY REPORT*  Clinical Data: Back pain  THORACIC SPINE - 2 VIEW  Comparison:  None.  Findings:  There is no evidence of thoracic spine fracture. Alignment is normal.  No other significant bone abnormalities are identified.  IMPRESSION: Negative.   Original Report Authenticated By: Janeece Riggers, M.D.    Dg Lumbar Spine 2-3 Views  04/19/2012  *RADIOLOGY REPORT*  Clinical Data: Back pain  LUMBAR SPINE - 2-3 VIEW  Comparison: CT 04/18/2012  Findings: Negative for fracture or mass.  Normal alignment. Negative for pars defect.  No significant degenerative change.  Contrast in the colon from recent CT.  IMPRESSION: Negative   Original Report Authenticated By: Janeece Riggers, M.D.    US Abdomen Complete  04/18/2012  *RADIOLOGY REPORT*  Clinical Data:  Abdominal pain.   COMPLETE ABDOMINAL ULTRASOUND  Comparison:  None.  Findings:  Gallbladder:  No shadowing gallstones or echogenic sludge.  No gallbladder wall thickening or pericholecystic fluid.  No sonographic Murphy's sign according to the ultrasound technologist. Wall thickness is 1.9 mm, within normal limits.  Common bile duct:  Normal in caliber. No biliary ductal dilation. The maximal diameter is 4.3 mm, within normal limits.  Liver:  No focal lesion identified.  Within normal limits in parenchymal echogenicity.  IVC:  Appears normal.  Pancreas:  No focal abnormality seen.  Spleen:  Normal size and echotexture without focal parenchymal abnormality.  The maximal length is 7.8 cm, within normal limits.  Right Kidney:  No hydronephrosis.  Well-preserved cortex.  Normal size and parenchymal echotexture without focal abnormalities. The maximal length is 10.9 cm, within normal limits.  Left Kidney:  No hydronephrosis.  Well-preserved cortex.  Normal size and parenchymal echotexture without focal abnormalities. The maximal length is 9.6 cm, within normal limits.  Abdominal aorta:  No aneurysm identified.  IMPRESSION: Negative abdominal ultrasound.   Original Report Authenticated By: Marin Roberts, M.D.    Ct Abdomen Pelvis W Contrast  04/18/2012  *RADIOLOGY REPORT*  Clinical Data: 22 year old female with abdominal and pelvic pain.  CT ABDOMEN AND PELVIS WITH CONTRAST  Technique:  Multidetector CT imaging of the abdomen and pelvis was performed following the standard protocol during bolus administration of intravenous contrast.  Contrast: 100 ml intravenous Omnipaque-300  Comparison: None  Findings: There is mild peripancreatic inflammation compatible with pancreatitis.  There is no evidence of pancreatic necrosis, acute collections, hemorrhage, or venous thrombosis.  The liver, spleen, kidneys, gallbladder and adrenal glands are  unremarkable. There is no evidence of biliary dilatation or definite choledocholithiasis.  A  trace amount of free fluid within the pelvis is noted. There is no evidence of enlarged lymph nodes or abdominal aortic aneurysm.  The bowel, appendix and bladder are unremarkable.  No acute or suspicious bony abnormalities are present.  IMPRESSION: Pancreatitis without complicating features.  Trace free pelvic fluid - question physiologic or related to pancreatitis.   Original Report Authenticated By: Harmon Pier, M.D.          Subjective: Patient states that pain has improved with more frequent dosing of hydromorphone. Denies fevers, chills, chest pain, shortness breath, vomiting, diarrhea, dysuria, hematuria patient has some nausea. No rashes. No headache.  Objective: Filed Vitals:   04/20/12 0548 04/20/12 1402 04/20/12 2200 04/21/12 0608  BP: 110/70 130/64 106/52 128/61  Pulse: 88 77 76 86  Temp: 97.8 F (36.6 C) 98.8 F (37.1 C) 98.5 F (36.9 C) 99 F (37.2 C)  TempSrc: Oral Oral    Resp: 16 18 16 18   Height:      Weight:      SpO2: 96% 97% 100% 100%    Intake/Output Summary (Last 24 hours) at 04/21/12 1004 Last data filed at 04/21/12 1610  Gross per 24 hour  Intake   4405 ml  Output   4700 ml  Net   -295 ml   Weight change:  Exam:   General:  Pt is alert, follows commands appropriately, not in acute distress  HEENT: No icterus, No thrush,[place]/AT  Cardiovascular: RRR, S1/S2, no rubs, no gallops  Respiratory: CTA bilaterally, no wheezing, no crackles, no rhonchi  Abdomen: Soft/+BS, mild epigastric tenderness without any peritoneal signs non distended, no guarding  Extremities: No edema, No lymphangitis, No petechiae, No rashes, no synovitis  Data Reviewed: Basic Metabolic Panel:  Recent Labs Lab 04/18/12 1430 04/19/12 0542 04/20/12 0425 04/21/12 0559  NA 135 136 135 136  K 4.1 4.1 3.4* 3.8  CL 98 102 102 101  CO2 26 24 24 25   GLUCOSE 98 95 75 85  BUN 8 8 7  4*  CREATININE 1.03 0.95 1.04 0.94  CALCIUM 10.0 9.0 8.7 9.1  MG  --   --   --  1.7  PHOS   --  4.7*  --   --    Liver Function Tests:  Recent Labs Lab 04/18/12 1430  AST 12  ALT 8  ALKPHOS 57  BILITOT 0.5  PROT 7.8  ALBUMIN 3.9    Recent Labs Lab 04/18/12 1430 04/19/12 0542  LIPASE 67* 58   No results found for this basename: AMMONIA,  in the last 168 hours CBC:  Recent Labs Lab 04/18/12 1430 04/20/12 0425 04/21/12 0559  WBC 7.5 6.3 6.1  NEUTROABS 5.8  --   --   HGB 12.2 10.7* 10.8*  HCT 36.6 32.5* 32.2*  MCV 79.6 80.4 79.7  PLT 296 257 247   Cardiac Enzymes: No results found for this basename: CKTOTAL, CKMB, CKMBINDEX, TROPONINI,  in the last 168 hours BNP: No components found with this basename: POCBNP,  CBG: No results found for this basename: GLUCAP,  in the last 168 hours  No results found for this or any previous visit (from the past 240 hour(s)).   Scheduled Meds: . pantoprazole (PROTONIX) IV  40 mg Intravenous Q24H   Continuous Infusions: . sodium chloride 150 mL/hr at 04/21/12 0517     Clelia Trabucco, DO  [facility] Pager [phone]  If 7PM-7AM, please contact night-coverage  [email or web address] Password [email or web address] 04/21/2012, 10:04 AM   LOS: 3 days

## 2012-04-22 LAB — BASIC METABOLIC PANEL
BUN: 4 mg/dL — ABNORMAL LOW (ref 6–23)
Chloride: 102 mEq/L (ref 96–112)
Creatinine, Ser: 0.89 mg/dL (ref 0.50–1.10)
GFR calc Af Amer: >90 mL/min (ref >90)
GFR calc non Af Amer: >90 mL/min (ref >90)

## 2012-04-22 NOTE — Progress Notes (Signed)
[facility] PROGRESS NOTE  Laura Greene VWU:[id number] DOB: [birth year] DOA: 04/18/2012 PCP: No primary provider on file.  Assessment/Plan: Acute pancreatitis  -Patient has improved with more frequent dosing of hydromorphone  -Start clear liquid diet--some mild pain with CL -advance to full liquids today (04/22/12) -Continue Dilaudid 1 mg and she feels that her pain is really 50% when she is given the medication  -Change hydromorphone to every 2 hours  -Low suspicion for medication induced  -Only new medication was ibuprofen  -Suspect idiopathic versus viral versus autoimmune  -Patient had flulike symptoms approximately 2 weeks prior to admission  -Check HIV, ANA, ANCA, rheumatoid factor--all negative  -Continue IV fluids, bowel rest  -Urine drug screen--positve opiate  -Lipase 67 the time of admission  -Abdominal ultrasound unremarkable  -TSH--1.962  -Check morning BMP, CBC Gastroesophageal reflux disease  -Continue proton pump inhibitor  Back pain  -improving  -Plain films of the thoracic and lumbar spine--neg  -Likely related to her pancreatitis  Family Communication: updated mother  Disposition Plan: Home when medically stable        Procedures/Studies: Dg Thoracic Spine 2 View  04/19/2012  *RADIOLOGY REPORT*  Clinical Data: Back pain  THORACIC SPINE - 2 VIEW  Comparison:  None.  Findings:  There is no evidence of thoracic spine fracture. Alignment is normal.  No other significant bone abnormalities are identified.  IMPRESSION: Negative.   Original Report Authenticated By: Janeece Riggers, M.D.    Dg Lumbar Spine 2-3 Views  04/19/2012  *RADIOLOGY REPORT*  Clinical Data: Back pain  LUMBAR SPINE - 2-3 VIEW  Comparison: CT 04/18/2012  Findings: Negative for fracture or mass.  Normal alignment. Negative for pars defect.  No significant degenerative change.  Contrast in the colon from recent CT.  IMPRESSION: Negative   Original Report Authenticated By: Janeece Riggers, M.D.     US Abdomen Complete  04/18/2012  *RADIOLOGY REPORT*  Clinical Data:  Abdominal pain.  COMPLETE ABDOMINAL ULTRASOUND  Comparison:  None.  Findings:  Gallbladder:  No shadowing gallstones or echogenic sludge.  No gallbladder wall thickening or pericholecystic fluid.  No sonographic Murphy's sign according to the ultrasound technologist. Wall thickness is 1.9 mm, within normal limits.  Common bile duct:  Normal in caliber. No biliary ductal dilation. The maximal diameter is 4.3 mm, within normal limits.  Liver:  No focal lesion identified.  Within normal limits in parenchymal echogenicity.  IVC:  Appears normal.  Pancreas:  No focal abnormality seen.  Spleen:  Normal size and echotexture without focal parenchymal abnormality.  The maximal length is 7.8 cm, within normal limits.  Right Kidney:  No hydronephrosis.  Well-preserved cortex.  Normal size and parenchymal echotexture without focal abnormalities. The maximal length is 10.9 cm, within normal limits.  Left Kidney:  No hydronephrosis.  Well-preserved cortex.  Normal size and parenchymal echotexture without focal abnormalities. The maximal length is 9.6 cm, within normal limits.  Abdominal aorta:  No aneurysm identified.  IMPRESSION: Negative abdominal ultrasound.   Original Report Authenticated By: Marin Roberts, M.D.    Ct Abdomen Pelvis W Contrast  04/18/2012  *RADIOLOGY REPORT*  Clinical Data: 22 year old female with abdominal and pelvic pain.  CT ABDOMEN AND PELVIS WITH CONTRAST  Technique:  Multidetector CT imaging of the abdomen and pelvis was performed following the standard protocol during bolus administration of intravenous contrast.  Contrast: 100 ml intravenous Omnipaque-300  Comparison: None  Findings: There is mild peripancreatic inflammation compatible with pancreatitis.  There is no evidence of pancreatic necrosis,  acute collections, hemorrhage, or venous thrombosis.  The liver, spleen, kidneys, gallbladder and adrenal glands are  unremarkable. There is no evidence of biliary dilatation or definite choledocholithiasis.  A trace amount of free fluid within the pelvis is noted. There is no evidence of enlarged lymph nodes or abdominal aortic aneurysm.  The bowel, appendix and bladder are unremarkable.  No acute or suspicious bony abnormalities are present.  IMPRESSION: Pancreatitis without complicating features.  Trace free pelvic fluid - question physiologic or related to pancreatitis.   Original Report Authenticated By: Harmon Pier, M.D.          Subjective: Patient denies fevers, chills, chest pain, shortness breath, vomiting, diarrhea. Overall her abdominal pain has improved, but she states that she still has some pain with clear liquids. Some nausea without emesis.  Objective: Filed Vitals:   04/21/12 0608 04/21/12 1326 04/21/12 2100 04/22/12 0500  BP: 128/61 105/67 125/79 110/72  Pulse: 86 86 79 82  Temp: 99 F (37.2 C) 97.8 F (36.6 C) 98.4 F (36.9 C) 98.2 F (36.8 C)  TempSrc:  Oral Oral Oral  Resp: 18 18 16 12   Height:      Weight:      SpO2: 100% 97% 98% 95%    Intake/Output Summary (Last 24 hours) at 04/22/12 1150 Last data filed at 04/22/12 1144  Gross per 24 hour  Intake   3585 ml  Output   4000 ml  Net   -415 ml   Weight change:  Exam:   General:  Pt is alert, follows commands appropriately, not in acute distress  HEENT: No icterus, No thrush,[place]/AT  Cardiovascular: RRR, S1/S2, no rubs, no gallops  Respiratory: CTA bilaterally, no wheezing, no crackles, no rhonchi  Abdomen: Soft/+BS, mild epigastric tenderness without any peritoneal signs or guarding, non distended, no guarding  Extremities: No edema, No lymphangitis, No petechiae, No rashes, no synovitis  Data Reviewed: Basic Metabolic Panel:  Recent Labs Lab 04/18/12 1430 04/19/12 0542 04/20/12 0425 04/21/12 0559 04/22/12 0430  NA 135 136 135 136 136  K 4.1 4.1 3.4* 3.8 3.8  CL 98 102 102 101 102  CO2 26 24 24 25 26    GLUCOSE 98 95 75 85 82  BUN 8 8 7  4* 4*  CREATININE 1.03 0.95 1.04 0.94 0.89  CALCIUM 10.0 9.0 8.7 9.1 8.8  MG  --   --   --  1.7  --   PHOS  --  4.7*  --   --   --    Liver Function Tests:  Recent Labs Lab 04/18/12 1430  AST 12  ALT 8  ALKPHOS 57  BILITOT 0.5  PROT 7.8  ALBUMIN 3.9    Recent Labs Lab 04/18/12 1430 04/19/12 0542  LIPASE 67* 58   No results found for this basename: AMMONIA,  in the last 168 hours CBC:  Recent Labs Lab 04/18/12 1430 04/20/12 0425 04/21/12 0559  WBC 7.5 6.3 6.1  NEUTROABS 5.8  --   --   HGB 12.2 10.7* 10.8*  HCT 36.6 32.5* 32.2*  MCV 79.6 80.4 79.7  PLT 296 257 247   Cardiac Enzymes: No results found for this basename: CKTOTAL, CKMB, CKMBINDEX, TROPONINI,  in the last 168 hours BNP: No components found with this basename: POCBNP,  CBG: No results found for this basename: GLUCAP,  in the last 168 hours  No results found for this or any previous visit (from the past 240 hour(s)).   Scheduled Meds: . pantoprazole (PROTONIX) IV  40 mg Intravenous Q24H   Continuous Infusions: . sodium chloride 150 mL/hr at 04/22/12 0848     Weldon Nouri, DO  [facility] Pager [phone]  If 7PM-7AM, please contact night-coverage [email or web address] Password [facility] 04/22/2012, 11:50 AM   LOS: 4 days

## 2012-04-23 DIAGNOSIS — J45909 Unspecified asthma, uncomplicated: Secondary | ICD-10-CM

## 2012-04-23 LAB — CBC
MCV: 78.9 fL (ref 78.0–100.0)
Platelets: 281 10*3/uL (ref 150–400)
RBC: 3.84 MIL/uL — ABNORMAL LOW (ref 3.87–5.11)
RDW: 12.7 % (ref 11.5–15.5)
WBC: 4.6 10*3/uL (ref 4.0–10.5)

## 2012-04-23 LAB — BASIC METABOLIC PANEL
CO2: 25 mEq/L (ref 19–32)
Calcium: 9 mg/dL (ref 8.4–10.5)
Creatinine, Ser: 0.88 mg/dL (ref 0.50–1.10)
GFR calc Af Amer: >90 mL/min (ref >90)
GFR calc non Af Amer: >90 mL/min (ref >90)
Sodium: 137 mEq/L (ref 135–145)

## 2012-04-23 LAB — LIPASE, BLOOD: Lipase: 47 U/L (ref 11–59)

## 2012-04-23 NOTE — Progress Notes (Addendum)
[facility] PROGRESS NOTE  Zury Fazzino ZOX:[id number] DOB: 03/11/1990 DOA: 04/18/2012 PCP: No primary provider on file.  Assessment/Plan: Acute pancreatitis  -Overall, appears pt has clinically improved, but concerned that pt still has significant pain with advancement of diet  -some mild pain with CL  -advance to full liquids (04/22/12)-->pt complains significant pain -consult GI-->?need for further intervention for PUD -Continue Dilaudid 1 mg and she feels that her pain is really 50% when she is given the medication  -Changed hydromorphone to every 2 hours  -Low suspicion for medication induced  -Only new medication was ibuprofen  -Suspect idiopathic versus viral versus autoimmune  -Patient had flulike symptoms approximately 2 weeks prior to admission  -Check HIV, ANA, ANCA, rheumatoid factor--all negative  -Continue IV fluids, bowel rest  -Urine drug screen--positve opiate  -Lipase 67 the time of admission  -Abdominal ultrasound unremarkable  -TSH--1.962  -Check morning BMP, CBC  Gastroesophageal reflux disease  -Continue proton pump inhibitor  Back pain  -improved -Plain films of the thoracic and lumbar spine--neg  -Likely related to her pancreatitis  Family Communication: updated mother  Disposition Plan: Home when medically stable          Procedures/Studies: Dg Thoracic Spine 2 View  04/19/2012  *RADIOLOGY REPORT*  Clinical Data: Back pain  THORACIC SPINE - 2 VIEW  Comparison:  None.  Findings:  There is no evidence of thoracic spine fracture. Alignment is normal.  No other significant bone abnormalities are identified.  IMPRESSION: Negative.   Original Report Authenticated By: Janeece Riggers, M.D.    Dg Lumbar Spine 2-3 Views  04/19/2012  *RADIOLOGY REPORT*  Clinical Data: Back pain  LUMBAR SPINE - 2-3 VIEW  Comparison: CT 04/18/2012  Findings: Negative for fracture or mass.  Normal alignment. Negative for pars defect.  No significant degenerative change.   Contrast in the colon from recent CT.  IMPRESSION: Negative   Original Report Authenticated By: Janeece Riggers, M.D.    US Abdomen Complete  04/18/2012  *RADIOLOGY REPORT*  Clinical Data:  Abdominal pain.  COMPLETE ABDOMINAL ULTRASOUND  Comparison:  None.  Findings:  Gallbladder:  No shadowing gallstones or echogenic sludge.  No gallbladder wall thickening or pericholecystic fluid.  No sonographic Murphy's sign according to the ultrasound technologist. Wall thickness is 1.9 mm, within normal limits.  Common bile duct:  Normal in caliber. No biliary ductal dilation. The maximal diameter is 4.3 mm, within normal limits.  Liver:  No focal lesion identified.  Within normal limits in parenchymal echogenicity.  IVC:  Appears normal.  Pancreas:  No focal abnormality seen.  Spleen:  Normal size and echotexture without focal parenchymal abnormality.  The maximal length is 7.8 cm, within normal limits.  Right Kidney:  No hydronephrosis.  Well-preserved cortex.  Normal size and parenchymal echotexture without focal abnormalities. The maximal length is 10.9 cm, within normal limits.  Left Kidney:  No hydronephrosis.  Well-preserved cortex.  Normal size and parenchymal echotexture without focal abnormalities. The maximal length is 9.6 cm, within normal limits.  Abdominal aorta:  No aneurysm identified.  IMPRESSION: Negative abdominal ultrasound.   Original Report Authenticated By: Marin Roberts, M.D.    Ct Abdomen Pelvis W Contrast  04/18/2012  *RADIOLOGY REPORT*  Clinical Data: 22 year old female with abdominal and pelvic pain.  CT ABDOMEN AND PELVIS WITH CONTRAST  Technique:  Multidetector CT imaging of the abdomen and pelvis was performed following the standard protocol during bolus administration of intravenous contrast.  Contrast: 100 ml intravenous Omnipaque-300  Comparison: None  Findings: There is mild peripancreatic inflammation compatible with pancreatitis.  There is no evidence of pancreatic necrosis, acute  collections, hemorrhage, or venous thrombosis.  The liver, spleen, kidneys, gallbladder and adrenal glands are unremarkable. There is no evidence of biliary dilatation or definite choledocholithiasis.  A trace amount of free fluid within the pelvis is noted. There is no evidence of enlarged lymph nodes or abdominal aortic aneurysm.  The bowel, appendix and bladder are unremarkable.  No acute or suspicious bony abnormalities are present.  IMPRESSION: Pancreatitis without complicating features.  Trace free pelvic fluid - question physiologic or related to pancreatitis.   Original Report Authenticated By: Harmon Pier, M.D.          Subjective: Patient had increased abd pain with full liquids with grits, milk, yogurt.  She did not have any vomiting, diarrhea, fevers, chills, chest pain, shortness of breath, headache. No new rashes. No dysuria.  Objective: Filed Vitals:   04/21/12 2100 04/22/12 0500 04/22/12 2138 04/23/12 0635  BP: 125/79 110/72 114/71 114/74  Pulse: 79 82 76 65  Temp: 98.4 F (36.9 C) 98.2 F (36.8 C) 98.1 F (36.7 C) 97.8 F (36.6 C)  TempSrc: Oral Oral Oral Oral  Resp: 16 12 16 16   Height:      Weight:      SpO2: 98% 95% 100% 99%    Intake/Output Summary (Last 24 hours) at 04/23/12 0830 Last data filed at 04/23/12 9629  Gross per 24 hour  Intake 4237.5 ml  Output   4300 ml  Net  -62.5 ml   Weight change:  Exam:   General:  Pt is alert, follows commands appropriately, not in acute distress  HEENT: No icterus, No thrush,  [place]/AT  Cardiovascular: RRR, S1/S2, no rubs, no gallops  Respiratory: CTA bilaterally, no wheezing, no crackles, no rhonchi  Abdomen: Soft/+BS, mild epigastric tenderness, non distended, no guarding  Extremities: No edema, No lymphangitis, No petechiae, No rashes, no synovitis  Data Reviewed: Basic Metabolic Panel:  Recent Labs Lab 04/18/12 1430 04/19/12 0542 04/20/12 0425 04/21/12 0559 04/22/12 0430 04/23/12 0429  NA 135 136  135 136 136 137  K 4.1 4.1 3.4* 3.8 3.8 3.6  CL 98 102 102 101 102 103  CO2 26 24 24 25 26 25   GLUCOSE 98 95 75 85 82 121*  BUN 8 8 7  4* 4* 4*  CREATININE 1.03 0.95 1.04 0.94 0.89 0.88  CALCIUM 10.0 9.0 8.7 9.1 8.8 9.0  MG  --   --   --  1.7  --   --   PHOS  --  4.7*  --   --   --   --    Liver Function Tests:  Recent Labs Lab 04/18/12 1430  AST 12  ALT 8  ALKPHOS 57  BILITOT 0.5  PROT 7.8  ALBUMIN 3.9    Recent Labs Lab 04/18/12 1430 04/19/12 0542 04/23/12 0429  LIPASE 67* 58 47   No results found for this basename: AMMONIA,  in the last 168 hours CBC:  Recent Labs Lab 04/18/12 1430 04/20/12 0425 04/21/12 0559 04/23/12 0429  WBC 7.5 6.3 6.1 4.6  NEUTROABS 5.8  --   --   --   HGB 12.2 10.7* 10.8* 10.1*  HCT 36.6 32.5* 32.2* 30.3*  MCV 79.6 80.4 79.7 78.9  PLT 296 257 247 281   Cardiac Enzymes: No results found for this basename: CKTOTAL, CKMB, CKMBINDEX, TROPONINI,  in the last 168 hours BNP: No components found with this  basename: POCBNP,  CBG: No results found for this basename: GLUCAP,  in the last 168 hours  No results found for this or any previous visit (from the past 240 hour(s)).   Scheduled Meds: . pantoprazole (PROTONIX) IV  40 mg Intravenous Q24H   Continuous Infusions: . sodium chloride 150 mL/hr at 04/23/12 0549     Kalman Nylen, DO  [facility] Pager [phone]  If 7PM-7AM, please contact night-coverage [email or web address] Password [email or web address] 04/23/2012, 8:30 AM   LOS: 5 days

## 2012-04-23 NOTE — Consult Note (Signed)
Reason for Consult: Abdominal pain. Referring Physician: THP  Laura Greene is an 22 y.o. female.  HPI: 22 year old, morbidly obese, black female who gives a history of severe epigastric pain that started after ate a large fried meal on Friday 04/20/12. This was associated with nausea and retrosternal chest pain. She now gives a history of LUQ pain radiating to her back; she claims this has been on since she came into the ER. She had a minimal elevation in her Lipase and a CT scan done revealed mild peripancreatic stranding. She however has had ongoing abdominal pain after she was given clears. Therefore a GI consult was called. Patient gives a history of chronic constipation and has gone upto 3 days without a BM. She has not had a BM since admission.  There is no history of melena or hematochezia. She denies having any dysphagia or odynophagia. There is no history of ulcers, jaundice or colitis. Her Lipase has since normalized but her abdominal pain persists. She has a hemoglobin of 10.1 gm/dl down from 54.0JW/JX on admission ?dilutional. Her Lipid panel was essentially normal.          Past Medical History  Diagnosis Date  . GERD (gastroesophageal reflux disease)   . Asthma    Past Surgical History  Procedure Laterality Date  . No past surgeries     History reviewed. No pertinent family history.  Social History:  reports that she has never smoked. She has never used smokeless tobacco. She reports that she does not drink alcohol or use illicit drugs.  Allergies: No Known Allergies  Medications: I have reviewed the patient's current medications.  Results for orders placed during the hospital encounter of 04/18/12 (from the past 48 hour(s))  BASIC METABOLIC PANEL     Status: Abnormal   Collection Time    04/22/12  4:30 AM      Result Value Range   Sodium 136  135 - 145 mEq/L   Potassium 3.8  3.5 - 5.1 mEq/L   Chloride 102  96 - 112 mEq/L   CO2 26  19 - 32 mEq/L   Glucose, Bld 82  70 -  99 mg/dL   BUN 4 (*) 6 - 23 mg/dL   Creatinine, Ser 9.14  0.50 - 1.10 mg/dL   Calcium 8.8  8.4 - 78.2 mg/dL   GFR calc non Af Amer >90  >90 mL/min   GFR calc Af Amer >90  >90 mL/min   Comment:            The eGFR has been calculated     using the CKD EPI equation.     This calculation has not been     validated in all clinical     situations.     eGFR's persistently     <90 mL/min signify     possible Chronic Kidney Disease.  BASIC METABOLIC PANEL     Status: Abnormal   Collection Time    04/23/12  4:29 AM      Result Value Range   Sodium 137  135 - 145 mEq/L   Potassium 3.6  3.5 - 5.1 mEq/L   Chloride 103  96 - 112 mEq/L   CO2 25  19 - 32 mEq/L   Glucose, Bld 121 (*) 70 - 99 mg/dL   BUN 4 (*) 6 - 23 mg/dL   Creatinine, Ser 9.56  0.50 - 1.10 mg/dL   Calcium 9.0  8.4 - 21.3 mg/dL   GFR  calc non Af Amer >90  >90 mL/min   GFR calc Af Amer >90  >90 mL/min   Comment:            The eGFR has been calculated     using the CKD EPI equation.     This calculation has not been     validated in all clinical     situations.     eGFR's persistently     <90 mL/min signify     possible Chronic Kidney Disease.  CBC     Status: Abnormal   Collection Time    04/23/12  4:29 AM      Result Value Range   WBC 4.6  4.0 - 10.5 K/uL   RBC 3.84 (*) 3.87 - 5.11 MIL/uL   Hemoglobin 10.1 (*) 12.0 - 15.0 g/dL   HCT 45.4 (*) 09.8 - 11.9 %   MCV 78.9  78.0 - 100.0 fL   MCH 26.3  26.0 - 34.0 pg   MCHC 33.3  30.0 - 36.0 g/dL   RDW 14.7  82.9 - 56.2 %   Platelets 281  150 - 400 K/uL  LIPASE, BLOOD     Status: None   Collection Time    04/23/12  4:29 AM      Result Value Range   Lipase 47  11 - 59 U/L   No results found.  Review of Systems  Constitutional: Negative for fever, chills, weight loss, malaise/fatigue and diaphoresis.  HENT: Negative for neck pain.   Eyes: Negative.   Respiratory: Negative.   Cardiovascular: Positive for chest pain. Negative for palpitations, orthopnea,  claudication, leg swelling and PND.  Gastrointestinal: Positive for heartburn, nausea, vomiting, abdominal pain, constipation and melena. Negative for diarrhea and blood in stool.  Genitourinary: Negative.   Musculoskeletal: Positive for back pain and joint pain. Negative for myalgias and falls.  Skin: Negative.   Neurological: Negative.   Endo/Heme/Allergies: Negative.   Psychiatric/Behavioral: Negative.    Blood pressure 116/60, pulse 65, temperature 97.8 F (36.6 C), temperature source Oral, resp. rate 18, height 5\' 4"  (1.626 m), weight 98.1 kg (216 lb 4.3 oz), last menstrual period 03/30/2012, SpO2 100.00%. Physical Exam  Constitutional: She is oriented to person, place, and time. She appears well-developed and well-nourished.  HENT:  Head: Normocephalic and atraumatic.  Eyes: Conjunctivae are normal. Pupils are equal, round, and reactive to light.  Neck: Normal range of motion. Neck supple.  Cardiovascular: Normal rate and regular rhythm.   Respiratory: Effort normal and breath sounds normal.  GI: Soft. Bowel sounds are normal. She exhibits no distension and no mass. There is tenderness. There is no rebound and no guarding.  Musculoskeletal: Normal range of motion.  Neurological: She is alert and oriented to person, place, and time.  Skin: Skin is warm and dry.  Psychiatric: She has a normal mood and affect. Her behavior is normal. Judgment and thought content normal.   Assessment/Plan: 1) GERD/Epigastric pain with nausea-will plan a EGD tomorrow. Continue PPI's for now. 2) ?Acute pancreatitis: now improved. 3) Asthma.  4) Morbid obesity. 5) Back pain.  Laura Greene 04/23/2012, 6:27 PM

## 2012-04-24 ENCOUNTER — Encounter (HOSPITAL_COMMUNITY): Payer: Self-pay | Admitting: *Deleted

## 2012-04-24 ENCOUNTER — Encounter (HOSPITAL_COMMUNITY)
Admission: EM | Disposition: A | Discharge status: Home / Self Care | Payer: Self-pay | Source: Home / Self Care | Attending: Internal Medicine

## 2012-04-24 DIAGNOSIS — K5900 Constipation, unspecified: Secondary | ICD-10-CM

## 2012-04-24 HISTORY — PX: ESOPHAGOGASTRODUODENOSCOPY: SHX5428

## 2012-04-24 SURGERY — EGD (ESOPHAGOGASTRODUODENOSCOPY)
Anesthesia: Moderate Sedation

## 2012-04-24 MED ORDER — DOCUSATE SODIUM 100 MG PO CAPS
100.0000 mg | ORAL_CAPSULE | Freq: Two times a day (BID) | ORAL | Status: DC
  Administered 2012-04-24: 100 mg via ORAL
  Filled 2012-04-24 (×4): qty 1

## 2012-04-24 MED ORDER — DSS 100 MG PO CAPS: 100.0000 mg | ORAL_CAPSULE | Freq: Two times a day (BID) | ORAL | Status: DC

## 2012-04-24 MED ORDER — SENNA 8.6 MG PO TABS: 2.0000 | ORAL_TABLET | Freq: Every day | ORAL | Status: DC

## 2012-04-24 MED ORDER — LIDOCAINE VISCOUS 2 % MT SOLN
OROMUCOSAL | Status: AC
  Filled 2012-04-24: qty 15

## 2012-04-24 MED ORDER — SODIUM CHLORIDE 0.9 % IV SOLN
INTRAVENOUS | Status: DC
  Administered 2012-04-24: 11:00:00 via INTRAVENOUS

## 2012-04-24 MED ORDER — MIDAZOLAM HCL 10 MG/2ML IJ SOLN
INTRAMUSCULAR | Status: AC
  Filled 2012-04-24: qty 4

## 2012-04-24 MED ORDER — OXYCODONE-ACETAMINOPHEN 5-325 MG PO TABS: 1.0000 | ORAL_TABLET | ORAL | Status: DC | PRN

## 2012-04-24 MED ORDER — LIDOCAINE VISCOUS 2 % MT SOLN
OROMUCOSAL | Status: DC | PRN
  Administered 2012-04-24: 4 mL via OROMUCOSAL

## 2012-04-24 MED ORDER — FENTANYL CITRATE 0.05 MG/ML IJ SOLN
INTRAMUSCULAR | Status: AC
  Filled 2012-04-24: qty 4

## 2012-04-24 MED ORDER — SENNA 8.6 MG PO TABS
2.0000 | ORAL_TABLET | Freq: Every day | ORAL | Status: DC
  Administered 2012-04-24: 17.2 mg via ORAL
  Filled 2012-04-24 (×2): qty 2

## 2012-04-24 MED ORDER — FENTANYL CITRATE 0.05 MG/ML IJ SOLN
INTRAMUSCULAR | Status: DC | PRN
  Administered 2012-04-24 (×2): 25 ug via INTRAVENOUS

## 2012-04-24 MED ORDER — MIDAZOLAM HCL 10 MG/2ML IJ SOLN
INTRAMUSCULAR | Status: DC | PRN
  Administered 2012-04-24 (×3): 2 mg via INTRAVENOUS

## 2012-04-24 NOTE — CV Procedure (Signed)
See original consult note-EGD being done for nausea and epigastric pain.

## 2012-04-24 NOTE — Discharge Summary (Signed)
Physician Discharge Summary  Laura Greene ZHY:[id number] DOB: Jun 24, 1990 DOA: 04/18/2012  PCP: No primary provider on file.  Admit date: 04/18/2012 Discharge date: 04/24/2012  Recommendations for Outpatient Follow-up:  1. Pt will need to follow up with PCP in 2 weeks post discharge 2. Please obtain BMP to evaluate electrolytes and kidney function 3. Please also check CBC to evaluate Hg and Hct levels 4. Follow up with Dr. Loreta Ave in office in 2 weeks   Discharge Diagnoses:  Active Problems:   Pancreatitis, acute   GERD (gastroesophageal reflux disease)   Back pain   Abdominal pain, epigastric Acute pancreatitis  -pt initially started on clear liquid, but complained of increased pain so pt made npo again -after 24 hrs of npo and conservative management, her pain improved along with more frequent dosing of hydromorphone;  She never had any vomiting -she was advanced to clear liquids again which she tolerated but had some increase in  epigastric pain -diet was advanced to full liquids, but she had significant increase in epigastric pain--her repeat lipase was 47 with unremarkable BMP and CBC -As a result, GI, Dr. Loreta Ave was consulted to see patient  -EGD was performed on 04/24/2012--revealed a small hiatal hernia, but otherwise negative -I personally spoke with Dr. Loreta Ave who felt that pancreatitis may have been less likely and that the patient's abdominal pain is more likely due to constipation.  Dr. Loreta Ave felt that the patient is safe for discharge from GI standpoint. -The patient was instructed to minimize opioid use -Percocet 5-325, #20, one po q4hrs prn pain, no refills -Patient was instructed to get Colace and Senokot over-the-counter to help with bowel movement. -Patient was given instructions to follow up with Dr. Loreta Ave in 2 weeks -Continue Dilaudid 1 mg and she feels that her pain is really 50% when she is given the medication  -underlying etiology of pancreatitis likely idiopathic -Low  suspicion for medication induced  -Only new medication was ibuprofen of which pt only took 2 doses  -Suspect idiopathic versus viral versus autoimmune  -Patient had flulike symptoms approximately 2 weeks prior to admission  -Check HIV, ANA, ANCA, rheumatoid factor--all negative  -pt was continued on IVF throughout the hospitalization -lipids showed triglycerides 48 -Urine drug screen--positve opiate only -Lipase 67 the time of admission  -Abdominal ultrasound unremarkable, no ductal dilatation, neg GB -CT abdomen showed mild peripancreatic inflammation without necrosis or pseudocyst -TSH--1.962, urine pregnancy test was negative and UA did not show any pyruia. Gastroesophageal reflux disease  -Continue proton pump inhibitor  Back pain  -improving  -Plain films of the thoracic and lumbar spine--neg  -Likely related to her pancreatitis  Family Communication: updated mother  Disposition Plan: Home when medically stable   Discharge Condition: stable  Disposition: home  Diet: low fat, heart healthy Wt Readings from Last 3 Encounters:  04/18/12 98.1 kg (216 lb 4.3 oz) (98%*, Z = 2.16)  04/18/12 98.1 kg (216 lb 4.3 oz) (98%*, Z = 2.16)   * Growth percentiles are based on CDC 2-20 Years data.    History of present illness:  22 y.o. female with history of GERD who presents with complaints of worsening abdominal pain x5 days- mostly in left upper quadrant and epigastrium. She reports that the pain also radiates to her back. She admits to nausea and had only one episode of vomitus without hematemesis.  She also denies alcohol, no drug use and states she does not take any supplements. She was seen in the ED and abdominal ultrasound  was done and came back negative, her lipase came back mildly elevated at 67, a CT scan of her abdomen and pelvis was done and showed findings consistent with pancreatitis without complicating features. She is admitted further evaluation and management.  She does not  take birth control. She denied f/c, hematochezia, melena, dysuria.  Urine pregnancy test was neg    Discharge Exam: Filed Vitals:   04/24/12 1345  BP: 137/72  Pulse:   Temp:   Resp: 14   Filed Vitals:   04/24/12 1330 04/24/12 1335 04/24/12 1340 04/24/12 1345  BP: 134/84 134/84 137/72 137/72  Pulse:      Temp:      TempSrc:      Resp: 19 29 15 14   Height:      Weight:      SpO2: 96% 96% 97% 93%   General: A&O x 3, NAD, pleasant, cooperative Cardiovascular: RRR, no rub, no gallop, no S3 Respiratory: CTAB, no wheeze, no rhonchi Abdomen:soft, mild epigastric tenderness without any guarding or peritoneal sign, nondistended, positive bowel sounds Extremities: No edema, No lymphangitis, no petechiae  Discharge Instructions     Medication List    STOP taking these medications       bismuth subsalicylate 262 MG/15ML suspension  Commonly known as:  PEPTO BISMOL      TAKE these medications       DSS 100 MG Caps  Take 100 mg by mouth 2 (two) times daily.     esomeprazole 40 MG capsule  Commonly known as:  NEXIUM  Take 40 mg by mouth daily before breakfast.     oxyCODONE-acetaminophen 5-325 MG per tablet  Commonly known as:  ROXICET  Take 1 tablet by mouth every 4 (four) hours as needed for pain.     senna 8.6 MG Tabs  Commonly known as:  SENOKOT  Take 2 tablets (17.2 mg total) by mouth daily.         The results of significant diagnostics from this hospitalization (including imaging, microbiology, ancillary and laboratory) are listed below for reference.    Significant Diagnostic Studies: Dg Thoracic Spine 2 View  04/19/2012  *RADIOLOGY REPORT*  Clinical Data: Back pain  THORACIC SPINE - 2 VIEW  Comparison:  None.  Findings:  There is no evidence of thoracic spine fracture. Alignment is normal.  No other significant bone abnormalities are identified.  IMPRESSION: Negative.   Original Report Authenticated By: Janeece Riggers, M.D.    Dg Lumbar Spine 2-3  Views  04/19/2012  *RADIOLOGY REPORT*  Clinical Data: Back pain  LUMBAR SPINE - 2-3 VIEW  Comparison: CT 04/18/2012  Findings: Negative for fracture or mass.  Normal alignment. Negative for pars defect.  No significant degenerative change.  Contrast in the colon from recent CT.  IMPRESSION: Negative   Original Report Authenticated By: Janeece Riggers, M.D.    US Abdomen Complete  04/18/2012  *RADIOLOGY REPORT*  Clinical Data:  Abdominal pain.  COMPLETE ABDOMINAL ULTRASOUND  Comparison:  None.  Findings:  Gallbladder:  No shadowing gallstones or echogenic sludge.  No gallbladder wall thickening or pericholecystic fluid.  No sonographic Murphy's sign according to the ultrasound technologist. Wall thickness is 1.9 mm, within normal limits.  Common bile duct:  Normal in caliber. No biliary ductal dilation. The maximal diameter is 4.3 mm, within normal limits.  Liver:  No focal lesion identified.  Within normal limits in parenchymal echogenicity.  IVC:  Appears normal.  Pancreas:  No focal abnormality seen.  Spleen:  Normal  size and echotexture without focal parenchymal abnormality.  The maximal length is 7.8 cm, within normal limits.  Right Kidney:  No hydronephrosis.  Well-preserved cortex.  Normal size and parenchymal echotexture without focal abnormalities. The maximal length is 10.9 cm, within normal limits.  Left Kidney:  No hydronephrosis.  Well-preserved cortex.  Normal size and parenchymal echotexture without focal abnormalities. The maximal length is 9.6 cm, within normal limits.  Abdominal aorta:  No aneurysm identified.  IMPRESSION: Negative abdominal ultrasound.   Original Report Authenticated By: Marin Roberts, M.D.    Ct Abdomen Pelvis W Contrast  04/18/2012  *RADIOLOGY REPORT*  Clinical Data: 22 year old female with abdominal and pelvic pain.  CT ABDOMEN AND PELVIS WITH CONTRAST  Technique:  Multidetector CT imaging of the abdomen and pelvis was performed following the standard protocol during  bolus administration of intravenous contrast.  Contrast: 100 ml intravenous Omnipaque-300  Comparison: None  Findings: There is mild peripancreatic inflammation compatible with pancreatitis.  There is no evidence of pancreatic necrosis, acute collections, hemorrhage, or venous thrombosis.  The liver, spleen, kidneys, gallbladder and adrenal glands are unremarkable. There is no evidence of biliary dilatation or definite choledocholithiasis.  A trace amount of free fluid within the pelvis is noted. There is no evidence of enlarged lymph nodes or abdominal aortic aneurysm.  The bowel, appendix and bladder are unremarkable.  No acute or suspicious bony abnormalities are present.  IMPRESSION: Pancreatitis without complicating features.  Trace free pelvic fluid - question physiologic or related to pancreatitis.   Original Report Authenticated By: Harmon Pier, M.D.      Microbiology: No results found for this or any previous visit (from the past 240 hour(s)).   Labs: Basic Metabolic Panel:  Recent Labs Lab 04/18/12 1430 04/19/12 0542 04/20/12 0425 04/21/12 0559 04/22/12 0430 04/23/12 0429  NA 135 136 135 136 136 137  K 4.1 4.1 3.4* 3.8 3.8 3.6  CL 98 102 102 101 102 103  CO2 26 24 24 25 26 25   GLUCOSE 98 95 75 85 82 121*  BUN 8 8 7  4* 4* 4*  CREATININE 1.03 0.95 1.04 0.94 0.89 0.88  CALCIUM 10.0 9.0 8.7 9.1 8.8 9.0  MG  --   --   --  1.7  --   --   PHOS  --  4.7*  --   --   --   --    Liver Function Tests:  Recent Labs Lab 04/18/12 1430  AST 12  ALT 8  ALKPHOS 57  BILITOT 0.5  PROT 7.8  ALBUMIN 3.9    Recent Labs Lab 04/18/12 1430 04/19/12 0542 04/23/12 0429  LIPASE 67* 58 47   No results found for this basename: AMMONIA,  in the last 168 hours CBC:  Recent Labs Lab 04/18/12 1430 04/20/12 0425 04/21/12 0559 04/23/12 0429  WBC 7.5 6.3 6.1 4.6  NEUTROABS 5.8  --   --   --   HGB 12.2 10.7* 10.8* 10.1*  HCT 36.6 32.5* 32.2* 30.3*  MCV 79.6 80.4 79.7 78.9  PLT 296  257 247 281   Cardiac Enzymes: No results found for this basename: CKTOTAL, CKMB, CKMBINDEX, TROPONINI,  in the last 168 hours BNP: No components found with this basename: POCBNP,  CBG: No results found for this basename: GLUCAP,  in the last 168 hours  Time coordinating discharge:  Greater than 30 minutes  Signed:  Hommer Cunliffe, DO [facility] Pager: [phone] 04/24/2012, 4:44 PM

## 2012-04-24 NOTE — Op Note (Signed)
[facility] [address] [place], [address]   OPERATIVE PROCEDURE REPORT  PATIENT :Laura Greene, Laura Greene  MR#: [id number] BIRTHDATE :1990/10/19 GENDER: Female ENDOSCOPIST: Dr.  Lorenza Burton, MD ASSISTANT:   Jamal Maes, RN & Oletha Blend, technician PROCEDURE DATE: 2012-05-19 PRE-PROCEDURE PREPERATION: Patient fasted for 4 hours prior to procedure. PRE-PROCEDURE PHYSICAL: Patient has stable vital signs.  Neck is supple.  There is no JVD, thyromegaly or LAD.  Chest clear to auscultation.  S1 and S2 regular.  Abdomen soft, morbidly obese, non-distended with epigastric tenderness on palpation with NABS. PROCEDURE:     EGD, diagnostic ASA CLASS:     Class II INDICATIONS:     Epigastric pain and nausea. MEDICATIONS:     Fentanyl 50 mcg & Versed 6 mg IV . TOPICAL ANESTHETIC:   Viscous Xylocaine-10 cc PO.  DESCRIPTION OF PROCEDURE: After the risks benefits and alternatives of the procedure were thoroughly explained, informed consent was obtained.  The Pentax Gastroscope Peds [id number]  was introduced through the mouth and advanced to the second portion of the duodenum , without limitations. The instrument was slowly withdrawn as the mucosa was fully examined.   The esophagus, stomach and the proximal small bowel appeared normal. There were no ulcers, erosions, masses or polyps noted. Retroflexed views revealed a small hiatal hernia. The scope was then withdrawn from the patient and the procedure terminated. The patient tolerated the procedure without immediate complications.  IMPRESSION:  Small hiatal hernia; otherwise, normal esophagogastroduodenoscopy.  RECOMMENDATIONS:     1.  Anti-reflux regimen to be followed. 2.  Continue PPI's. 3.  OP follow-up in 2 weeks. 4. Trial of stool softeners-CVolace 2 PO BID.  REPEAT EXAM:  None planned for now.  DISCHARGE INSTRUCTIONS: Standard discharge instructions given _______________________________ eSigned:  Dr.  Lorenza Burton, MD 05/19/12 1:13 PM   CPT CODES:     43235-EGD  DIAGNOSIS CODES:     787.02, 789.06, 553.3   CC: THP

## 2012-04-25 ENCOUNTER — Encounter (HOSPITAL_COMMUNITY): Payer: Self-pay | Admitting: Gastroenterology

## 2012-12-18 ENCOUNTER — Emergency Department (HOSPITAL_COMMUNITY)
Admission: EM | Admit: 2012-12-18 | Discharge: 2012-12-19 | Disposition: A | Discharge status: Home / Self Care | Payer: BC Managed Care – PPO | Attending: Emergency Medicine | Admitting: Emergency Medicine

## 2012-12-18 ENCOUNTER — Encounter (HOSPITAL_COMMUNITY): Payer: Self-pay | Admitting: Emergency Medicine

## 2012-12-18 DIAGNOSIS — T1590XA Foreign body on external eye, part unspecified, unspecified eye, initial encounter: Secondary | ICD-10-CM | POA: Insufficient documentation

## 2012-12-18 DIAGNOSIS — J45909 Unspecified asthma, uncomplicated: Secondary | ICD-10-CM | POA: Insufficient documentation

## 2012-12-18 DIAGNOSIS — Z79899 Other long term (current) drug therapy: Secondary | ICD-10-CM | POA: Insufficient documentation

## 2012-12-18 DIAGNOSIS — Z8719 Personal history of other diseases of the digestive system: Secondary | ICD-10-CM | POA: Insufficient documentation

## 2012-12-18 DIAGNOSIS — Y9289 Other specified places as the place of occurrence of the external cause: Secondary | ICD-10-CM | POA: Insufficient documentation

## 2012-12-18 DIAGNOSIS — W268XXA Contact with other sharp object(s), not elsewhere classified, initial encounter: Secondary | ICD-10-CM | POA: Insufficient documentation

## 2012-12-18 DIAGNOSIS — Y990 Civilian activity done for income or pay: Secondary | ICD-10-CM | POA: Insufficient documentation

## 2012-12-18 DIAGNOSIS — T1592XA Foreign body on external eye, part unspecified, left eye, initial encounter: Secondary | ICD-10-CM

## 2012-12-18 DIAGNOSIS — S058X9A Other injuries of unspecified eye and orbit, initial encounter: Secondary | ICD-10-CM | POA: Insufficient documentation

## 2012-12-18 DIAGNOSIS — Y9389 Activity, other specified: Secondary | ICD-10-CM | POA: Insufficient documentation

## 2012-12-18 MED ORDER — EYE WASH OPHTH SOLN
100.0000 [drp] | OPHTHALMIC | Status: DC | PRN
  Administered 2012-12-19: 100 [drp] via OPHTHALMIC
  Filled 2012-12-18: qty 118

## 2012-12-18 MED ORDER — FLUORESCEIN SODIUM 1 MG OP STRP
1.0000 | ORAL_STRIP | Freq: Once | OPHTHALMIC | Status: AC
  Administered 2012-12-19: 1 via OPHTHALMIC
  Filled 2012-12-18: qty 1

## 2012-12-18 MED ORDER — TETRACAINE HCL 0.5 % OP SOLN
2.0000 [drp] | Freq: Once | OPHTHALMIC | Status: AC
  Administered 2012-12-19: 2 [drp] via OPHTHALMIC
  Filled 2012-12-18: qty 2

## 2012-12-18 NOTE — ED Notes (Signed)
Pt presents with c/o glass in her left eye. Pt was cutting glass at work and did not have goggles on and got some glass in her left eye. Pt says she did flush her eye but wants to make sure the glass is completely out. Pt says she still feels something in her eye.

## 2012-12-18 NOTE — ED Provider Notes (Signed)
CSN: [id number]     Arrival date & time 12/18/12  2308 History  This chart was scribed for non-physician practitioner working with Loren Racer, MD by Ashley Jacobs, ED scribe. This patient was seen in room WTR7/WTR7 and the patient's care was started at 11:43 PM.   First MD Initiated Contact with Patient 12/18/12 2320     Chief Complaint  Patient presents with  . Foreign Body in Eye   (Consider location/radiation/quality/duration/timing/severity/associated sxs/prior Treatment) The history is provided by the patient and medical records. No language interpreter was used.   HPI Comments: Laura Greene is a 22 y.o. female who presents to the Emergency Department complaining of glass in left eye. Pt was cutting clear glass at her job today while not wearing safety goggles.She felt particles hit her Left eye she flushed her eye with water but she still feels there is glass debris in her eye. She explains when she looks down at  or down she feels the object.Pt does not have any known allergies to medications and has a medical hx of GERD and asthma. Pt does not smoke tobacco or drink alcohol.  Past Medical History  Diagnosis Date  . GERD (gastroesophageal reflux disease)   . Asthma    Past Surgical History  Procedure Laterality Date  . No past surgeries    . Esophagogastroduodenoscopy N/A 04/24/2012    Procedure: ESOPHAGOGASTRODUODENOSCOPY (EGD);  Surgeon: Charna Elizabeth, MD;  Location: [facility];  Service: Endoscopy;  Laterality: N/A;   No family history on file. History  Substance Use Topics  . Smoking status: Never Smoker   . Smokeless tobacco: Never Used  . Alcohol Use: No   OB History   Grav Para Term Preterm Abortions TAB SAB Ect Mult Living                 Review of Systems  Constitutional: Negative for fever and chills.  Eyes: Positive for pain. Negative for photophobia, discharge, redness, itching and visual disturbance.  Neurological: Negative for dizziness and  headaches.  All other systems reviewed and are negative.    Allergies  Review of patient's allergies indicates no known allergies.  Home Medications   Current Outpatient Rx  Name  Route  Sig  Dispense  Refill  . amphetamine-dextroamphetamine (ADDERALL) 15 MG tablet   Oral   Take 15 mg by mouth 2 (two) times daily.          BP 104/73  Pulse 84  Temp(Src) 98.3 F (36.8 C) (Oral)  Resp 20  Ht 5\' 4"  (1.626 m)  Wt 222 lb (100.699 kg)  BMI 38.09 kg/m2  SpO2 99% Physical Exam  Nursing note and vitals reviewed. Constitutional: She appears well-developed and well-nourished.  HENT:  Head: Normocephalic.  Eyes: Pupils are equal, round, and reactive to light.  Slit lamp exam:      The left eye shows corneal abrasion and fluorescein uptake. The left eye shows no hyphema and no anterior chamber bulge.  Fine abrasion in the lower portion of sclera not affecting the pupil.   Musculoskeletal: Normal range of motion. She exhibits no tenderness.  Neurological: She is alert.    ED Course  Procedures (including critical care time) DIAGNOSTIC STUDIES: Oxygen Saturation is 99% on room air, normal by my interpretation.    COORDINATION OF CARE: 11:49 PM Discussed course of care with pt which includes Pontocaine and fluorescein strip. Pt understands and agrees.  Labs Review Labs Reviewed - No data to display Imaging Review No results  found.  EKG Interpretation   None       MDM   1. FB eye, left, initial encounter    Due to number of fine abrasions will flush eye an additional time than apply erythromycin ointment and have patient FU with Dr. Charlotte Sanes in  the morning  I personally performed the services described in this documentation, which was scribed in my presence. The recorded information has been reviewed and is accurate.     Arman Filter, NP 12/19/12 [phone]

## 2012-12-19 MED ORDER — ERYTHROMYCIN 5 MG/GM OP OINT
TOPICAL_OINTMENT | Freq: Once | OPHTHALMIC | Status: AC
  Administered 2012-12-19: 01:00:00 via OPHTHALMIC
  Filled 2012-12-19: qty 3.5

## 2012-12-19 NOTE — ED Provider Notes (Signed)
Medical screening examination/treatment/procedure(s) were performed by non-physician practitioner and as supervising physician I was immediately available for consultation/collaboration.   Michall Noffke, MD 12/19/12 0355 

## 2014-04-26 ENCOUNTER — Encounter (HOSPITAL_COMMUNITY): Payer: Self-pay | Admitting: *Deleted

## 2014-04-26 ENCOUNTER — Emergency Department (HOSPITAL_COMMUNITY)
Admission: EM | Admit: 2014-04-26 | Discharge: 2014-04-26 | Disposition: A | Discharge status: Home / Self Care | Payer: BLUE CROSS/BLUE SHIELD | Source: Home / Self Care | Attending: Family Medicine | Admitting: Family Medicine

## 2014-04-26 DIAGNOSIS — J111 Influenza due to unidentified influenza virus with other respiratory manifestations: Secondary | ICD-10-CM

## 2014-04-26 DIAGNOSIS — R69 Illness, unspecified: Principal | ICD-10-CM

## 2014-04-26 NOTE — ED Notes (Signed)
Pt  Reports symptoms    Of body  Aches   Fever    Body         X  3  Days     He  Reports  It  hurts    When  She  Coughs             she  Reports     Symptoms  Not  releived  By otc meds

## 2014-04-26 NOTE — ED Provider Notes (Signed)
CSN: [id number]     Arrival date & time 04/26/14  1752 History   First MD Initiated Contact with Patient 04/26/14 1809     Chief Complaint  Patient presents with  . Fever   (Consider location/radiation/quality/duration/timing/severity/associated sxs/prior Treatment) Patient is a 24 y.o. female presenting with fever. The history is provided by the patient.  Fever Severity:  Moderate Onset quality:  Gradual Duration:  3 days Progression:  Unchanged Chronicity:  New Associated symptoms: chills, congestion, cough and rhinorrhea   Associated symptoms: no diarrhea, no nausea, no rash, no sore throat and no vomiting     Past Medical History  Diagnosis Date  . GERD (gastroesophageal reflux disease)   . Asthma    Past Surgical History  Procedure Laterality Date  . No past surgeries    . Esophagogastroduodenoscopy N/A 04/24/2012    Procedure: ESOPHAGOGASTRODUODENOSCOPY (EGD);  Surgeon: Juanita Craver, MD;  Location: [facility];  Service: Endoscopy;  Laterality: N/A;   No family history on file. History  Substance Use Topics  . Smoking status: Never Smoker   . Smokeless tobacco: Never Used  . Alcohol Use: No   OB History    No data available     Review of Systems  Constitutional: Positive for fever and chills.  HENT: Positive for congestion and rhinorrhea. Negative for sore throat.   Respiratory: Positive for cough.   Cardiovascular: Negative.   Gastrointestinal: Negative.  Negative for nausea, vomiting and diarrhea.  Genitourinary: Negative.   Skin: Negative for rash.    Allergies  Review of patient's allergies indicates no known allergies.  Home Medications   Prior to Admission medications   Medication Sig Start Date End Date Taking? Authorizing Provider  PARoxetine HCl (PAXIL PO) Take by mouth.   Yes Historical Provider, MD  amphetamine-dextroamphetamine (ADDERALL) 15 MG tablet Take 15 mg by mouth 2 (two) times daily.    Historical Provider, MD   BP 124/72 mmHg   Pulse 115  Temp(Src) 101.5 F (38.6 C) (Oral)  Resp 20  SpO2 100%  LMP 04/26/2014 Physical Exam  Constitutional: She is oriented to person, place, and time. She appears well-developed and well-nourished. No distress.  HENT:  Head: Normocephalic.  Right Ear: External ear normal.  Left Ear: External ear normal.  Mouth/Throat: Oropharynx is clear and moist.  Neck: Normal range of motion. Neck supple.  Cardiovascular: Normal rate, regular rhythm, normal heart sounds and intact distal pulses.   Pulmonary/Chest: Effort normal and breath sounds normal. She has no rales.  Abdominal: Soft. Bowel sounds are normal. There is no tenderness.  Lymphadenopathy:    She has no cervical adenopathy.  Neurological: She is alert and oriented to person, place, and time.  Skin: Skin is warm and dry.  Nursing note and vitals reviewed.   ED Course  Procedures (including critical care time) Labs Review Labs Reviewed - No data to display  Imaging Review No results found.   MDM   1. Influenza-like illness        Billy Fischer, MD 04/26/14 [phone]

## 2014-05-01 ENCOUNTER — Encounter (HOSPITAL_COMMUNITY): Payer: Self-pay | Admitting: *Deleted

## 2014-05-01 ENCOUNTER — Emergency Department (INDEPENDENT_AMBULATORY_CARE_PROVIDER_SITE_OTHER)
Admission: EM | Admit: 2014-05-01 | Discharge: 2014-05-01 | Disposition: A | Discharge status: Home / Self Care | Payer: BLUE CROSS/BLUE SHIELD | Source: Home / Self Care | Attending: Emergency Medicine | Admitting: Emergency Medicine

## 2014-05-01 DIAGNOSIS — R6889 Other general symptoms and signs: Secondary | ICD-10-CM | POA: Diagnosis not present

## 2014-05-01 NOTE — Discharge Instructions (Signed)
You are recovering well from the flu. You can take Mucinex to help with the lingering congestion. It may take another week to fully recover. Follow-up as needed.

## 2014-05-01 NOTE — ED Notes (Signed)
C/o body aches, fever 100.0, sore throat since Sat., but now is pressure, and cough.  Was diagnosed with flu on Sat.

## 2014-05-01 NOTE — ED Provider Notes (Signed)
CSN: [id number]     Arrival date & time 05/01/14  1608 History   First MD Initiated Contact with Patient 05/01/14 1707     Chief Complaint  Patient presents with  . URI   (Consider location/radiation/quality/duration/timing/severity/associated sxs/prior Treatment) HPI  She is a 24 year old woman here for follow-up of flu symptoms. She was seen here 5 days ago and diagnosed with a flulike illness. She states since then most of her symptoms have improved, but she continues to have a congested feeling in her throat and chest. She is also worried that she is having continued fevers, but has not taken her temperature.  She states her nasal congestion and rhinorrhea has improved. Her cough is also improved. She continues to have brief episodes of left sided head pain and dizziness.  Past Medical History  Diagnosis Date  . GERD (gastroesophageal reflux disease)   . Asthma    Past Surgical History  Procedure Laterality Date  . No past surgeries    . Esophagogastroduodenoscopy N/A 04/24/2012    Procedure: ESOPHAGOGASTRODUODENOSCOPY (EGD);  Surgeon: Juanita Craver, MD;  Location: [facility];  Service: Endoscopy;  Laterality: N/A;   History reviewed. No pertinent family history. History  Substance Use Topics  . Smoking status: Never Smoker   . Smokeless tobacco: Never Used  . Alcohol Use: No   OB History    No data available     Review of Systems As in history of present illness Allergies  Review of patient's allergies indicates no known allergies.  Home Medications   Prior to Admission medications   Medication Sig Start Date End Date Taking? Authorizing Provider  FLUoxetine (PROZAC) 40 MG capsule Take 40 mg by mouth daily.   Yes Historical Provider, MD  lisdexamfetamine (VYVANSE) 50 MG capsule Take 50 mg by mouth daily.   Yes Historical Provider, MD  amphetamine-dextroamphetamine (ADDERALL) 15 MG tablet Take 15 mg by mouth 2 (two) times daily.    Historical Provider, MD  PARoxetine  HCl (PAXIL PO) Take by mouth.    Historical Provider, MD   BP 117/73 mmHg  Pulse 69  Temp(Src) 97.9 F (36.6 C) (Oral)  Resp 20  SpO2 99%  LMP 04/23/2014 Physical Exam  Constitutional: She is oriented to person, place, and time. She appears well-developed and well-nourished. No distress.  HENT:  Nose: Nose normal.  Mouth/Throat: Oropharynx is clear and moist. No oropharyngeal exudate.  Neck: Neck supple.  Cardiovascular: Normal rate, regular rhythm and normal heart sounds.   No murmur heard. Pulmonary/Chest: Effort normal and breath sounds normal. No respiratory distress. She has no wheezes. She has no rales.  Lymphadenopathy:    She has no cervical adenopathy.  Neurological: She is alert and oriented to person, place, and time.    ED Course  Procedures (including critical care time) Labs Review Labs Reviewed - No data to display  Imaging Review No results found.   MDM   1. Flu-like symptoms    Provided reassurance that she is recovering as expected from influenza. She can take some Mucinex to help with the congested feeling. Follow-up as needed.    Melony Overly, MD 05/01/14 1728

## 2014-07-16 ENCOUNTER — Encounter (HOSPITAL_COMMUNITY): Payer: Self-pay

## 2014-07-16 ENCOUNTER — Emergency Department (HOSPITAL_COMMUNITY)
Admission: EM | Admit: 2014-07-16 | Discharge: 2014-07-17 | Disposition: A | Discharge status: Home / Self Care | Payer: BLUE CROSS/BLUE SHIELD | Attending: Emergency Medicine | Admitting: Emergency Medicine

## 2014-07-16 ENCOUNTER — Emergency Department (HOSPITAL_COMMUNITY): Payer: BLUE CROSS/BLUE SHIELD

## 2014-07-16 DIAGNOSIS — J45909 Unspecified asthma, uncomplicated: Secondary | ICD-10-CM | POA: Diagnosis not present

## 2014-07-16 DIAGNOSIS — Y92410 Unspecified street and highway as the place of occurrence of the external cause: Secondary | ICD-10-CM | POA: Insufficient documentation

## 2014-07-16 DIAGNOSIS — S24109A Unspecified injury at unspecified level of thoracic spinal cord, initial encounter: Secondary | ICD-10-CM | POA: Insufficient documentation

## 2014-07-16 DIAGNOSIS — S8992XA Unspecified injury of left lower leg, initial encounter: Secondary | ICD-10-CM | POA: Insufficient documentation

## 2014-07-16 DIAGNOSIS — Y998 Other external cause status: Secondary | ICD-10-CM | POA: Diagnosis not present

## 2014-07-16 DIAGNOSIS — Y9389 Activity, other specified: Secondary | ICD-10-CM | POA: Diagnosis not present

## 2014-07-16 DIAGNOSIS — Z3202 Encounter for pregnancy test, result negative: Secondary | ICD-10-CM | POA: Insufficient documentation

## 2014-07-16 DIAGNOSIS — S6992XA Unspecified injury of left wrist, hand and finger(s), initial encounter: Secondary | ICD-10-CM | POA: Diagnosis not present

## 2014-07-16 DIAGNOSIS — Z8719 Personal history of other diseases of the digestive system: Secondary | ICD-10-CM | POA: Insufficient documentation

## 2014-07-16 LAB — POC URINE PREG, ED: Preg Test, Ur: NEGATIVE
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
## 2014-07-17 MED ORDER — IBUPROFEN 800 MG PO TABS: 800.0000 mg | ORAL_TABLET | Freq: Three times a day (TID) | ORAL | Status: DC

## 2014-07-17 MED ORDER — METHOCARBAMOL 500 MG PO TABS: 500.0000 mg | ORAL_TABLET | Freq: Two times a day (BID) | ORAL | Status: DC

## 2014-07-17 NOTE — Discharge Instructions (Signed)
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
## 2014-12-16 ENCOUNTER — Ambulatory Visit (INDEPENDENT_AMBULATORY_CARE_PROVIDER_SITE_OTHER): Payer: Self-pay | Admitting: Family Medicine

## 2014-12-16 VITALS — BP 124/70 | HR 98 | Temp 98.4°F | Resp 18 | Ht 64.0 in | Wt 243.0 lb

## 2014-12-16 DIAGNOSIS — B002 Herpesviral gingivostomatitis and pharyngotonsillitis: Secondary | ICD-10-CM

## 2014-12-16 MED ORDER — VALACYCLOVIR HCL 500 MG PO TABS: 500.0000 mg | ORAL_TABLET | Freq: Every day | ORAL | Status: DC

## 2014-12-16 NOTE — Progress Notes (Signed)
Subjective:    Patient ID: Laura Greene, female    DOB: [birth year], 24 y.o.   MRN: [id number] This chart was scribed for Merri Ray, MD by Marti Sleigh, Medical Scribe. This patient was seen in Room 5 and the patient's care was started a 7:39 PM.  Chief Complaint  Patient presents with  . Mouth Lesions    off and on since 2012 upper lip    HPI HPI Comments: Laura Greene is a 24 y.o. female who presents to [facility] complaining of lesions on her lip. She states she has been diagnosed with HSV 1 in the past but is not taking any current medications. Her current breakout started three days ago. Her previous breakout happened eight months ago. Her first breakout happened in her senior year of high school. Another fever blister during finals in college last year, and was treated at that time with valtrex. She is in a relationship now, and is concerned about spreading the virus.   Patient Active Problem List   Diagnosis Date Noted  . Unspecified constipation 04/24/2012  . Pancreatitis, acute 04/19/2012  . Back pain 04/19/2012  . Abdominal pain, epigastric 04/19/2012  . GERD (gastroesophageal reflux disease)   . Asthma    Past Medical History  Diagnosis Date  . GERD (gastroesophageal reflux disease)   . Asthma   . Anxiety   . Depression    Past Surgical History  Procedure Laterality Date  . No past surgeries    . Esophagogastroduodenoscopy N/A 04/24/2012    Procedure: ESOPHAGOGASTRODUODENOSCOPY (EGD);  Surgeon: Juanita Craver, MD;  Location: [facility];  Service: Endoscopy;  Laterality: N/A;   No Known Allergies Prior to Admission medications   Medication Sig Start Date End Date Taking? Authorizing Provider  amphetamine-dextroamphetamine (ADDERALL) 10 MG tablet Take 10 mg by mouth daily with breakfast.   Yes Historical Provider, MD  FLUoxetine (PROZAC) 40 MG capsule Take 40 mg by mouth daily.   Yes Historical Provider, MD  hydrALAZINE (APRESOLINE) 25 MG tablet Take 25 mg by  mouth 3 (three) times daily.   Yes Historical Provider, MD  lisdexamfetamine (VYVANSE) 50 MG capsule Take 50 mg by mouth daily.   Yes Historical Provider, MD   Social History   Social History  . Marital Status: Single    Spouse Name: N/A  . Number of Children: N/A  . Years of Education: N/A   Occupational History  . Not on file.   Social History Main Topics  . Smoking status: Never Smoker   . Smokeless tobacco: Never Used  . Alcohol Use: 0.0 - 0.6 oz/week    0-1 Standard drinks or equivalent per week  . Drug Use: No  . Sexual Activity: Not on file   Other Topics Concern  . Not on file   Social History Narrative    Review of Systems  Constitutional: Negative for fever and chills.  Skin: Positive for color change and wound.       Objective:  BP 124/70 mmHg  Pulse 98  Temp(Src) 98.4 F (36.9 C) (Oral)  Resp 18  Ht 5\' 4"  (1.626 m)  Wt 243 lb (110.224 kg)  BMI 41.69 kg/m2  SpO2 98%  LMP 12/02/2014  Physical Exam  Constitutional: She is oriented to person, place, and time. She appears well-developed and well-nourished. No distress.  HENT:  Head: Normocephalic and atraumatic.  Eyes: Pupils are equal, round, and reactive to light.  Neck: Neck supple.  Cardiovascular: Normal rate.   Pulmonary/Chest: Effort normal. No  respiratory distress.  Musculoskeletal: Normal range of motion.  Neurological: She is alert and oriented to person, place, and time. Coordination normal.  Skin: Skin is warm and dry. She is not diaphoretic.  Clustered vesicles on the upper lip. No surrounding erythema or edema. Lesions do not cross the philtrum.  Psychiatric: She has a normal mood and affect. Her behavior is normal.  Nursing note and vitals reviewed.      Assessment & Plan:   Laura Greene is a 24 y.o. female Recurrent oral herpes simplex - Plan: valACYclovir (VALTREX) 500 MG tablet  - discussed with patient and her partner  On phone typical course, treatment, and contagiousness  of HSV-1. Will try daily Valtrex at 500 mg daily  For suppression, but still discuss possible contagiousness and without symptoms.  Increase to 4  Tabs once followed by repeat dose of 4 tabs in 12 hours if onset of acute symptoms.  - Decided against any further testing here in the office at this time. RTC precautions.  Meds ordered this encounter  . valACYclovir (VALTREX) 500 MG tablet    Sig: Take 1 tablet (500 mg total) by mouth daily.    Dispense:  90 tablet    Refill:  3   Patient Instructions  Start Valtrex once per day to lessen chance of having a sore or transmission. As we discussed, avoid any contact when you do have symptoms of a cold sore or one is visible. If you do have onset of the symptoms such as tingling or burning of lip or recurrence of the rash, increase the Valtrex to 4 pills once followed by another 4 pills once in 12 hours. After that is done you can return to one pill per day for suppression. If there are any further questions please let me know.  Cold Sore A cold sore (fever blister) is a skin infection caused by the herpes simplex virus (HSV-1). HSV-1 is closely related to the virus that causes genital herpes (HSV-2), but they are not the same even though both viruses can cause oral and genital infections. Cold sores are small, fluid-filled sores inside of the mouth or on the lips, gums, nose, chin, cheeks, or fingers.  The herpes simplex virus can be easily passed (contagious) to other people through close personal contact, such as kissing or sharing personal items. The virus can also spread to other parts of the body, such as the eyes or genitals. Cold sores are contagious until the sores crust over completely. They often heal within 2 weeks.  Once a person is infected, the herpes simplex virus remains permanently in the body. Therefore, there is no cure for cold sores, and they often recur when a person is tired, stressed, sick, or gets too much sun. Additional factors  that can cause a recurrence include hormone changes in menstruation or pregnancy, certain drugs, and cold weather.  CAUSES  Cold sores are caused by the herpes simplex virus. The virus is spread from person to person through close contact, such as through kissing, touching the affected area, or sharing personal items such as lip balm, razors, or eating utensils.  SYMPTOMS  The first infection may not cause symptoms. If symptoms develop, the symptoms often go through different stages. Here is how a cold sore develops:   Tingling, itching, or burning is felt 1-2 days before the outbreak.   Fluid-filled blisters appear on the lips, inside the mouth, nose, or on the cheeks.   The blisters start to ooze clear  fluid.   The blisters dry up and a yellow crust appears in its place.   The crust falls off.  Symptoms depend on whether it is the initial outbreak or a recurrence. Some other symptoms with the first outbreak may include:   Fever.   Sore throat.   Headache.   Muscle aches.   Swollen neck glands.  DIAGNOSIS  A diagnosis is often made based on your symptoms and looking at the sores. Sometimes, a sore may be swabbed and then examined in the lab to make a final diagnosis. If the sores are not present, blood tests can find the herpes simplex virus.  TREATMENT  There is no cure for cold sores and no vaccine for the herpes simplex virus. Within 2 weeks, most cold sores go away on their own without treatment. Medicines cannot make the infection go away, but medicine can help relieve some of the pain associated with the sores, can work to stop the virus from multiplying, and can also shorten healing time. Medicine may be in the form of creams, gels, pills, or a shot.  HOME CARE INSTRUCTIONS   Only take over-the-counter or prescription medicines for pain, discomfort, or fever as directed by your caregiver. Do not use aspirin.   Use a cotton-tip swab to apply creams or gels to your  sores.   Do not touch the sores or pick the scabs. Wash your hands often. Do not touch your eyes without washing your hands first.   Avoid kissing, oral sex, and sharing personal items until sores heal.   Apply an ice pack on your sores for 10-15 minutes to ease any discomfort.   Avoid hot, cold, or salty foods because they may hurt your mouth. Eat a soft, bland diet to avoid irritating the sores. Use a straw to drink if you have pain when drinking out of a glass.   Keep sores clean and dry to prevent an infection of other tissues.   Avoid the sun and limit stress if these things trigger outbreaks. If sun causes cold sores, apply sunscreen on the lips before being out in the sun.  SEEK MEDICAL CARE IF:   You have a fever or persistent symptoms for more than 2-3 days.   You have a fever and your symptoms suddenly get worse.   You have pus, not clear fluid, coming from the sores.   You have redness that is spreading.   You have pain or irritation in your eye.   You get sores on your genitals.   Your sores do not heal within 2 weeks.   You have a weakened immune system.   You have frequent recurrences of cold sores.  MAKE SURE YOU:   Understand these instructions.  Will watch your condition.  Will get help right away if you are not doing well or get worse.   This information is not intended to replace advice given to you by your health care provider. Make sure you discuss any questions you have with your health care provider.   Document Released: 01/15/2000 Document Revised: 02/07/2014 Document Reviewed: 06/01/2011 Elsevier Interactive Patient Education [employer].     I personally performed the services described in this documentation, which was scribed in my presence. The recorded information has been reviewed and considered, and addended by me as needed.   By signing my name below, I, Judithe Modest, attest that this documentation has been  prepared under the direction and in the presence of Dellis Filbert  Carlota Raspberry, MD. Electronically Signed: Judithe Modest, ER Scribe. 12/16/2014. 7:39 PM.

## 2014-12-16 NOTE — Patient Instructions (Signed)
Start Valtrex once per day to lessen chance of having a sore or transmission. As we discussed, avoid any contact when you do have symptoms of a cold sore or one is visible. If you do have onset of the symptoms such as tingling or burning of lip or recurrence of the rash, increase the Valtrex to 4 pills once followed by another 4 pills once in 12 hours. After that is done you can return to one pill per day for suppression. If there are any further questions please let me know.  Cold Sore A cold sore (fever blister) is a skin infection caused by the herpes simplex virus (HSV-1). HSV-1 is closely related to the virus that causes genital herpes (HSV-2), but they are not the same even though both viruses can cause oral and genital infections. Cold sores are small, fluid-filled sores inside of the mouth or on the lips, gums, nose, chin, cheeks, or fingers.  The herpes simplex virus can be easily passed (contagious) to other people through close personal contact, such as kissing or sharing personal items. The virus can also spread to other parts of the body, such as the eyes or genitals. Cold sores are contagious until the sores crust over completely. They often heal within 2 weeks.  Once a person is infected, the herpes simplex virus remains permanently in the body. Therefore, there is no cure for cold sores, and they often recur when a person is tired, stressed, sick, or gets too much sun. Additional factors that can cause a recurrence include hormone changes in menstruation or pregnancy, certain drugs, and cold weather.  CAUSES  Cold sores are caused by the herpes simplex virus. The virus is spread from person to person through close contact, such as through kissing, touching the affected area, or sharing personal items such as lip balm, razors, or eating utensils.  SYMPTOMS  The first infection may not cause symptoms. If symptoms develop, the symptoms often go through different stages. Here is how a cold sore  develops:   Tingling, itching, or burning is felt 1-2 days before the outbreak.   Fluid-filled blisters appear on the lips, inside the mouth, nose, or on the cheeks.   The blisters start to ooze clear fluid.   The blisters dry up and a yellow crust appears in its place.   The crust falls off.  Symptoms depend on whether it is the initial outbreak or a recurrence. Some other symptoms with the first outbreak may include:   Fever.   Sore throat.   Headache.   Muscle aches.   Swollen neck glands.  DIAGNOSIS  A diagnosis is often made based on your symptoms and looking at the sores. Sometimes, a sore may be swabbed and then examined in the lab to make a final diagnosis. If the sores are not present, blood tests can find the herpes simplex virus.  TREATMENT  There is no cure for cold sores and no vaccine for the herpes simplex virus. Within 2 weeks, most cold sores go away on their own without treatment. Medicines cannot make the infection go away, but medicine can help relieve some of the pain associated with the sores, can work to stop the virus from multiplying, and can also shorten healing time. Medicine may be in the form of creams, gels, pills, or a shot.  HOME CARE INSTRUCTIONS   Only take over-the-counter or prescription medicines for pain, discomfort, or fever as directed by your caregiver. Do not use aspirin.   Use  a cotton-tip swab to apply creams or gels to your sores.   Do not touch the sores or pick the scabs. Wash your hands often. Do not touch your eyes without washing your hands first.   Avoid kissing, oral sex, and sharing personal items until sores heal.   Apply an ice pack on your sores for 10-15 minutes to ease any discomfort.   Avoid hot, cold, or salty foods because they may hurt your mouth. Eat a soft, bland diet to avoid irritating the sores. Use a straw to drink if you have pain when drinking out of a glass.   Keep sores clean and dry to  prevent an infection of other tissues.   Avoid the sun and limit stress if these things trigger outbreaks. If sun causes cold sores, apply sunscreen on the lips before being out in the sun.  SEEK MEDICAL CARE IF:   You have a fever or persistent symptoms for more than 2-3 days.   You have a fever and your symptoms suddenly get worse.   You have pus, not clear fluid, coming from the sores.   You have redness that is spreading.   You have pain or irritation in your eye.   You get sores on your genitals.   Your sores do not heal within 2 weeks.   You have a weakened immune system.   You have frequent recurrences of cold sores.  MAKE SURE YOU:   Understand these instructions.  Will watch your condition.  Will get help right away if you are not doing well or get worse.   This information is not intended to replace advice given to you by your health care provider. Make sure you discuss any questions you have with your health care provider.   Document Released: 01/15/2000 Document Revised: 02/07/2014 Document Reviewed: 06/01/2011 Elsevier Interactive Patient Education [employer].

## 2015-03-19 ENCOUNTER — Encounter (HOSPITAL_COMMUNITY): Payer: Self-pay | Admitting: *Deleted

## 2015-03-19 ENCOUNTER — Emergency Department (INDEPENDENT_AMBULATORY_CARE_PROVIDER_SITE_OTHER)
Admission: EM | Admit: 2015-03-19 | Discharge: 2015-03-19 | Disposition: A | Discharge status: Home / Self Care | Payer: Self-pay | Source: Home / Self Care | Attending: Family Medicine | Admitting: Family Medicine

## 2015-03-19 DIAGNOSIS — R112 Nausea with vomiting, unspecified: Secondary | ICD-10-CM

## 2015-03-19 DIAGNOSIS — R197 Diarrhea, unspecified: Secondary | ICD-10-CM

## 2015-03-19 DIAGNOSIS — R69 Illness, unspecified: Principal | ICD-10-CM

## 2015-03-19 DIAGNOSIS — J111 Influenza due to unidentified influenza virus with other respiratory manifestations: Secondary | ICD-10-CM

## 2015-03-19 MED ORDER — ONDANSETRON 4 MG PO TBDP: 4.0000 mg | ORAL_TABLET | Freq: Three times a day (TID) | ORAL | Status: DC | PRN

## 2015-03-19 NOTE — ED Provider Notes (Signed)
CSN: [id number]     Arrival date & time 03/19/15  1328 History   First MD Initiated Contact with Patient 03/19/15 1446     Chief Complaint  Patient presents with  . Diarrhea   (Consider location/radiation/quality/duration/timing/severity/associated sxs/prior Treatment) HPI Laura Greene is a 25 y.o. female presenting for fever.   She reports abrupt onset of malaise, fever, nonproductive cough as well as nausea, vomiting and watery diarrhea about 16 hours ago. No blood in stool. Has taken no medications. Not vaccinated for flu this year. She has been able to keep down fruit and water.   Recently traveled to [place]. No outdoor exposures, suspicious dietary exposures, sick contacts.   Past Medical History  Diagnosis Date  . GERD (gastroesophageal reflux disease)   . Asthma   . Anxiety   . Depression    Past Surgical History  Procedure Laterality Date  . No past surgeries    . Esophagogastroduodenoscopy N/A 04/24/2012    Procedure: ESOPHAGOGASTRODUODENOSCOPY (EGD);  Surgeon: Juanita Craver, MD;  Location: [facility];  Service: Endoscopy;  Laterality: N/A;   History reviewed. No pertinent family history. Social History  Substance Use Topics  . Smoking status: Never Smoker   . Smokeless tobacco: Never Used  . Alcohol Use: 0.0 - 0.6 oz/week    0-1 Standard drinks or equivalent per week   OB History    No data available     Review of Systems  Allergies  Review of patient's allergies indicates no known allergies.  Home Medications   Prior to Admission medications   Medication Sig Start Date End Date Taking? Authorizing Provider  amphetamine-dextroamphetamine (ADDERALL) 10 MG tablet Take 10 mg by mouth daily with breakfast.    Historical Provider, MD  FLUoxetine (PROZAC) 40 MG capsule Take 40 mg by mouth daily.    Historical Provider, MD  hydrALAZINE (APRESOLINE) 25 MG tablet Take 25 mg by mouth 3 (three) times daily.    Historical Provider, MD  lisdexamfetamine (VYVANSE)  50 MG capsule Take 50 mg by mouth daily.    Historical Provider, MD  ondansetron (ZOFRAN-ODT) 4 MG disintegrating tablet Take 1 tablet (4 mg total) by mouth every 8 (eight) hours as needed for nausea or vomiting. 03/19/15   Patrecia Pour, MD  valACYclovir (VALTREX) 500 MG tablet Take 1 tablet (500 mg total) by mouth daily. 12/16/14   Wendie Agreste, MD   Meds Ordered and Administered this Visit  Medications - No data to display  BP 118/84 mmHg  Pulse 109  Temp(Src) 101.6 F (38.7 C) (Oral)  Resp 16  SpO2 98%  LMP 03/14/2015 No data found.   Physical Exam  BP 118/84 mmHg  Pulse 109  Temp(Src) 101.6 F (38.7 C) (Oral)  Resp 16  SpO2 98%  LMP 03/14/2015 Gen: Tired-appearing 24 y.o.female in NAD HEENT: Normocephalic, sclerae clear, conjunctivae normal, pupils equal and reactive, nares normal, moist mucous membranes, posterior oropharynx erythematous without exudates, fair dentition CV: Regular rate, no murmur; radial, DP and PT pulses 2+ bilaterally; no LE edema, no JVD, cap refill < 2 sec. Pulm: Non-labored breathing ambient air; CTAB, no wheezes or crackles GI: Normoactive BS; soft, non-tender, non-distended, no organomegaly, no hernia appreciated Neuro: Alert and oriented x4, no focal deficits in sensation or motor function.  ED Course  Procedures (including critical care time)  Labs Review Labs Reviewed - No data to display  Imaging Review No results found.  MDM   1. Influenza-like illness   2. Diarrhea, unspecified type  3. Non-intractable vomiting with nausea, vomiting of unspecified type     Supportive measures reviewed. Return precautions especially for inability to take po.   This patient's case was discussed with the attending provider who examined the patient and helped formulate the plan of care.   Patrecia Pour, MD 03/19/15 [phone]

## 2015-03-19 NOTE — ED Notes (Signed)
PT    REPORTS   SYMPTOMS  OF      STOMACH  VIRUS   WITH       DIARRHEA     SINCE   600  PM     LAST  NIGHT

## 2015-03-19 NOTE — Discharge Instructions (Signed)
I think you have the flu. Good news is that it will go away on its own, but the bad news is that there is no medicine to make it go away faster. The most important treatment is hydration.   - Advance diet slowly avoiding high sugar floods and beverages. Gatorade G2 (better than regular gatorade) to maintain hydration. Clear broths are also fine.  - Have everyone wash their hands frequently. - Take zofran as needed for nausea and vomiting - If you are unable to keep anything down and become dehydrated you should return for care - You should stay out of work until you are symptom free for 24 - 48 hours.   Food Choices to Help Relieve Diarrhea, Adult When you have diarrhea, the foods you eat and your eating habits are very important. Choosing the right foods and drinks can help relieve diarrhea. Also, because diarrhea can last up to 7 days, you need to replace lost fluids and electrolytes (such as sodium, potassium, and chloride) in order to help prevent dehydration.  WHAT GENERAL GUIDELINES DO I NEED TO FOLLOW?  Slowly drink 1 cup (8 oz) of fluid for each episode of diarrhea. If you are getting enough fluid, your urine will be clear or pale yellow.  Eat starchy foods. Some good choices include white rice, white toast, pasta, low-fiber cereal, baked potatoes (without the skin), saltine crackers, and bagels.  Avoid large servings of any cooked vegetables.  Limit fruit to two servings per day. A serving is  cup or 1 small piece.  Choose foods with less than 2 g of fiber per serving.  Limit fats to less than 8 tsp (38 g) per day.  Avoid fried foods.  Eat foods that have probiotics in them. Probiotics can be found in certain dairy products.  Avoid foods and beverages that may increase the speed at which food moves through the stomach and intestines (gastrointestinal tract). Things to avoid include:  High-fiber foods, such as dried fruit, raw fruits and vegetables, nuts, seeds, and whole grain  foods.  Spicy foods and high-fat foods.  Foods and beverages sweetened with high-fructose corn syrup, honey, or sugar alcohols such as xylitol, sorbitol, and mannitol. WHAT FOODS ARE RECOMMENDED? Grains White rice. White, [place], or pita breads (fresh or toasted), including plain rolls, buns, or bagels. White pasta. Saltine, soda, or graham crackers. Pretzels. Low-fiber cereal. Cooked cereals made with water (such as cornmeal, farina, or cream cereals). Plain muffins. Matzo. Melba toast. Zwieback.  Vegetables Potatoes (without the skin). Strained tomato and vegetable juices. Most well-cooked and canned vegetables without seeds. Tender lettuce. Fruits Cooked or canned applesauce, apricots, cherries, fruit cocktail, grapefruit, peaches, pears, or plums. Fresh bananas, apples without skin, cherries, grapes, cantaloupe, grapefruit, peaches, oranges, or plums.  Meat and Other Protein Products Baked or boiled chicken. Eggs. Tofu. Fish. Seafood. Smooth peanut butter. Ground or well-cooked tender beef, ham, veal, lamb, pork, or poultry.  Dairy Plain yogurt, kefir, and unsweetened liquid yogurt. Lactose-free milk, buttermilk, or soy milk. Plain hard cheese. Beverages Sport drinks. Clear broths. Diluted fruit juices (except prune). Regular, caffeine-free sodas such as ginger ale. Water. Decaffeinated teas. Oral rehydration solutions. Sugar-free beverages not sweetened with sugar alcohols. Other Bouillon, broth, or soups made from recommended foods.  The items listed above may not be a complete list of recommended foods or beverages. Contact your dietitian for more options. WHAT FOODS ARE NOT RECOMMENDED? Grains Whole grain, whole wheat, bran, or rye breads, rolls, pastas, crackers, and cereals. Wild  or brown rice. Cereals that contain more than 2 g of fiber per serving. Corn tortillas or taco shells. Cooked or dry oatmeal. Granola. Popcorn. Vegetables Raw vegetables. Cabbage, broccoli, Brussels  sprouts, artichokes, baked beans, beet greens, corn, kale, legumes, peas, sweet potatoes, and yams. Potato skins. Cooked spinach and cabbage. Fruits Dried fruit, including raisins and dates. Raw fruits. Stewed or dried prunes. Fresh apples with skin, apricots, mangoes, pears, raspberries, and strawberries.  Meat and Other Protein Products Chunky peanut butter. Nuts and seeds. Beans and lentils. Berniece Salines.  Dairy High-fat cheeses. Milk, chocolate milk, and beverages made with milk, such as milk shakes. Cream. Ice cream. Sweets and Desserts Sweet rolls, doughnuts, and sweet breads. Pancakes and waffles. Fats and Oils Butter. Cream sauces. Margarine. Salad oils. Plain salad dressings. Olives. Avocados.  Beverages Caffeinated beverages (such as coffee, tea, soda, or energy drinks). Alcoholic beverages. Fruit juices with pulp. Prune juice. Soft drinks sweetened with high-fructose corn syrup or sugar alcohols. Other Coconut. Hot sauce. Chili powder. Mayonnaise. Gravy. Cream-based or milk-based soups.  The items listed above may not be a complete list of foods and beverages to avoid. Contact your dietitian for more information. WHAT SHOULD I DO IF I BECOME DEHYDRATED? Diarrhea can sometimes lead to dehydration. Signs of dehydration include dark urine and dry mouth and skin. If you think you are dehydrated, you should rehydrate with an oral rehydration solution. These solutions can be purchased at pharmacies, retail stores, or online.  Drink -1 cup (120-240 mL) of oral rehydration solution each time you have an episode of diarrhea. If drinking this amount makes your diarrhea worse, try drinking smaller amounts more often. For example, drink 1-3 tsp (5-15 mL) every 5-10 minutes.  A general rule for staying hydrated is to drink 1-2 L of fluid per day. Talk to your health care provider about the specific amount you should be drinking each day. Drink enough fluids to keep your urine clear or pale yellow.     This information is not intended to replace advice given to you by your health care provider. Make sure you discuss any questions you have with your health care provider.   Document Released: 04/09/2003 Document Revised: 02/07/2014 Document Reviewed: 12/10/2012 Elsevier Interactive Patient Education [employer].

## 2015-04-02 ENCOUNTER — Emergency Department (INDEPENDENT_AMBULATORY_CARE_PROVIDER_SITE_OTHER)
Admission: EM | Admit: 2015-04-02 | Discharge: 2015-04-02 | Disposition: A | Discharge status: Home / Self Care | Payer: Self-pay | Source: Home / Self Care | Attending: Family Medicine | Admitting: Family Medicine

## 2015-04-02 ENCOUNTER — Encounter (HOSPITAL_COMMUNITY): Payer: Self-pay | Admitting: Emergency Medicine

## 2015-04-02 DIAGNOSIS — H1013 Acute atopic conjunctivitis, bilateral: Secondary | ICD-10-CM

## 2015-04-02 MED ORDER — NAPHAZOLINE-PHENIRAMINE 0.025-0.3 % OP SOLN: 1.0000 [drp] | Freq: Four times a day (QID) | OPHTHALMIC | Status: DC

## 2015-04-02 NOTE — ED Notes (Signed)
Patient seen by frank patrick, pa only.

## 2015-04-02 NOTE — Discharge Instructions (Signed)
Allergic Conjunctivitis °Allergic conjunctivitis is inflammation of the clear membrane that covers the white part of your eye and the inner surface of your eyelid (conjunctiva), and it is caused by allergies. The blood vessels in the conjunctiva become inflamed, and this causes the eye to become red or pink, and it often causes itchiness in the eye. Allergic conjunctivitis cannot be spread by one person to another person (noncontagious). °CAUSES °This condition is caused by an allergic reaction. Common causes of an allergic reaction (allergens) include: °1. Dust. °2. Pollen. °3. Mold. °4. Animal dander or secretions. °RISK FACTORS °This condition is more likely to develop if you are exposed to high levels of allergens that cause the allergic reaction. This might include being outdoors when air pollen levels are high or being around animals that you are allergic to. °SYMPTOMS °Symptoms of this condition may include: °1. Eye redness. °2. Tearing of the eyes. °3. Watery eyes. °4. Itchy eyes. °5. Burning feeling in the eyes. °6. Clear drainage from the eyes. °7. Swollen eyelids. °DIAGNOSIS °This condition may be diagnosed by medical history and physical exam. If you have drainage from your eyes, it may be tested to rule out other causes of conjunctivitis. °TREATMENT °Treatment for this condition often includes medicines. These may be eye drops, ointments, or oral medicines. They may be prescription medicines or over-the-counter medicines. °HOME CARE INSTRUCTIONS °· Take or apply medicines only as directed by your health care provider. °· Do not touch or rub your eyes. °· Do not wear contact lenses until the inflammation is gone. Wear glasses instead. °· Do not wear eye makeup until the inflammation is gone. °· Apply a cool, clean washcloth to your eye for 10-20 minutes, 3-4 times a day. °· Try to avoid whatever allergen is causing the allergic reaction. °SEEK MEDICAL CARE IF: °· Your symptoms get worse. °· You have pus  draining from your eye. °· You have new symptoms. °· You have a fever. °  °This information is not intended to replace advice given to you by your health care provider. Make sure you discuss any questions you have with your health care provider. °  °Document Released: 04/09/2002 Document Revised: 02/07/2014 Document Reviewed: 10/29/2013 °Elsevier Interactive Patient Education ©2016 Elsevier Inc. ° °How to Use Eye Drops and Eye Ointments °HOW TO APPLY EYE DROPS °Follow these steps when applying eye drops: °5. Wash your hands. °6. Tilt your head back. °7. Put a finger under your eye and use it to gently pull your lower lid downward. Keep that finger in place. °8. Using your other hand, hold the dropper between your thumb and index finger. °9. Position the dropper just over the edge of the lower lid. Hold it as close to your eye as you can without touching the dropper to your eye. °10. Steady your hand. One way to do this is to lean your index finger against your brow. °11. Look up. °12. Slowly and gently squeeze one drop of medicine into your eye. °13. Close your eye. °14. Place a finger between your lower eyelid and your nose. Press gently for 2 minutes. This increases the amount of time that the medicine is exposed to the eye. It also reduces side effects that can develop if the drop gets into the bloodstream through the nose. °HOW TO APPLY EYE OINTMENTS °Follow these steps when applying eye ointments: °8. Wash your hands. °9. Put a finger under your eye and use it to gently pull your lower lid downward. Keep that   finger in place. °10. Using your other hand, place the tip of the tube between your thumb and index finger with the remaining fingers braced against your cheek or nose. °11. Hold the tube just over the edge of your lower lid without touching the tube to your lid or eyeball. °12. Look up. °13. Line the inner part of your lower lid with ointment. °14. Gently pull up on your upper lid and look down. This will  force the ointment to spread over the surface of the eye. °15. Release the upper lid. °16. If you can, close your eyes for 1-2 minutes. °Do not rub your eyes. If you applied the ointment correctly, your vision will be blurry for a few minutes. This is normal. °ADDITIONAL INFORMATION °· Make sure to use the eye drops or ointment as told by your health care provider. °· If you have been told to use both eye drops and an eye ointment, apply the eye drops first, then wait 3-4 minutes before you apply the ointment. °· Try not to touch the tip of the dropper or tube to your eye. A dropper or tube that has touched the eye can become contaminated. °  °This information is not intended to replace advice given to you by your health care provider. Make sure you discuss any questions you have with your health care provider. °  °Document Released: 04/25/2000 Document Revised: 06/03/2014 Document Reviewed: 01/13/2014 °Elsevier Interactive Patient Education ©2016 Elsevier Inc. ° °

## 2015-04-04 NOTE — ED Provider Notes (Signed)
CSN: [id number]     Arrival date & time 04/02/15  1819 History   First MD Initiated Contact with Patient 04/02/15 1935     Chief Complaint  Patient presents with  . Eye Problem   (Consider location/radiation/quality/duration/timing/severity/associated sxs/prior Treatment) Patient is a 25 y.o. female presenting with eye problem. The history is provided by the patient.  Eye Problem Quality:  Burning Severity:  Mild Onset quality:  Gradual Duration:  2 days Timing:  Constant Progression:  Partially resolved Chronicity:  Recurrent Relieved by:  None tried Worsened by:  Nothing tried Ineffective treatments:  Commercial eye wash Associated symptoms: itching     Past Medical History  Diagnosis Date  . GERD (gastroesophageal reflux disease)   . Asthma   . Anxiety   . Depression    Past Surgical History  Procedure Laterality Date  . No past surgeries    . Esophagogastroduodenoscopy N/A 04/24/2012    Procedure: ESOPHAGOGASTRODUODENOSCOPY (EGD);  Surgeon: Juanita Craver, MD;  Location: [facility];  Service: Endoscopy;  Laterality: N/A;   No family history on file. Social History  Substance Use Topics  . Smoking status: Never Smoker   . Smokeless tobacco: Never Used  . Alcohol Use: 0.0 - 0.6 oz/week    0-1 Standard drinks or equivalent per week   OB History    No data available     Review of Systems  Eyes: Positive for itching.    Allergies  Review of patient's allergies indicates no known allergies.  Home Medications   Prior to Admission medications   Medication Sig Start Date End Date Taking? Authorizing Provider  amphetamine-dextroamphetamine (ADDERALL) 10 MG tablet Take 10 mg by mouth daily with breakfast.    Historical Provider, MD  FLUoxetine (PROZAC) 40 MG capsule Take 40 mg by mouth daily.    Historical Provider, MD  hydrALAZINE (APRESOLINE) 25 MG tablet Take 25 mg by mouth 3 (three) times daily.    Historical Provider, MD  lisdexamfetamine (VYVANSE) 50 MG  capsule Take 50 mg by mouth daily.    Historical Provider, MD  naphazoline-pheniramine (NAPHCON-A) 0.025-0.3 % ophthalmic solution Place 1 drop into both eyes QID. 04/02/15   Konrad Felix, PA  ondansetron (ZOFRAN-ODT) 4 MG disintegrating tablet Take 1 tablet (4 mg total) by mouth every 8 (eight) hours as needed for nausea or vomiting. 03/19/15   Patrecia Pour, MD  valACYclovir (VALTREX) 500 MG tablet Take 1 tablet (500 mg total) by mouth daily. 12/16/14   Wendie Agreste, MD   Meds Ordered and Administered this Visit  Medications - No data to display  BP 129/80 mmHg  Pulse 77  Temp(Src) 98.9 F (37.2 C)  Resp 16  SpO2 99%  LMP 03/14/2015 No data found.   Physical Exam  Constitutional: She is oriented to person, place, and time. She appears well-developed and well-nourished.  HENT:  Head: Normocephalic and atraumatic.  Right Ear: External ear normal.  Left Ear: External ear normal.  Nose: Nose normal.  Mouth/Throat: Oropharynx is clear and moist.  Eyes: Right conjunctiva is injected. Left conjunctiva is injected.  No signs of follicular swelling, or exudate  Pulmonary/Chest: Effort normal.  Musculoskeletal: Normal range of motion.  Neurological: She is alert and oriented to person, place, and time.  Skin: Skin is warm and dry.  Psychiatric: She has a normal mood and affect. Her behavior is normal.  Nursing note and vitals reviewed.   ED Course  Procedures (including critical care time)  Labs Review Labs Reviewed -  No data to display  Imaging Review No results found.   Visual Acuity Review  Right Eye Distance:   Left Eye Distance:   Bilateral Distance:    Right Eye Near:   Left Eye Near:    Bilateral Near:        RX for opthal antihistamine Refer to opthal if not steadily getting better. MDM   1. Allergic conjunctivitis, bilateral    Patient is reassured that there are no issues that require transfer to higher level of care at this time.  Patient is  advised to continue home symptomatic treatment. Prescription is sent to  pharmacy patient has indicated.  Patient is advised that if there are new or worsening symptoms or attend the emergency department, or contact primary care provider. Instructions of care provided discharged home in stable condition. Return to work/school note provided.  THIS NOTE WAS GENERATED USING A VOICE RECOGNITION SOFTWARE PROGRAM. ALL REASONABLE EFFORTS  WERE MADE TO PROOFREAD THIS DOCUMENT FOR ACCURACY.     Konrad Felix, [place] 04/04/15 [phone]

## 2015-10-26 ENCOUNTER — Ambulatory Visit (HOSPITAL_COMMUNITY)
Admission: EM | Admit: 2015-10-26 | Discharge: 2015-10-26 | Disposition: A | Discharge status: Home / Self Care | Payer: Self-pay | Attending: Family Medicine | Admitting: Family Medicine

## 2015-10-26 ENCOUNTER — Encounter (HOSPITAL_COMMUNITY): Payer: Self-pay | Admitting: Family Medicine

## 2015-10-26 DIAGNOSIS — R0789 Other chest pain: Secondary | ICD-10-CM

## 2015-10-26 MED ORDER — PREDNISONE 20 MG PO TABS: ORAL_TABLET | ORAL | 0 refills | Status: DC

## 2015-10-26 NOTE — ED Provider Notes (Signed)
[place]    CSN: [id number] Arrival date & time: 10/26/15  1539  First Provider Contact:  First MD Initiated Contact with Patient 10/26/15 1618        History   Chief Complaint Chief Complaint  Patient presents with  . Generalized Body Aches    HPI Laura Greene is a 25 y.o. female.   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 She comes in today complaining about a syndrome is been going on for 3 years. She's had left-sided chest pain that is radicular in nature and starts below her left shoulder blade and radiates into her left breast. It is somewhat positional in that she has to lie certain ways to get comfortable for sleep. It also hurts when she takes deep breath.  She's had no fever.  She has had some left ankle pain and some left hip pain as well.  She is aware that some of her family members have rheumatoid arthritis (mother)  and an uncle may have sarcoid. She's had no shortness of breath but she has had a bit of a cough over the last week which is subsiding now.  At one point she was admitted and numerous tests were done. She had ability over $18,000 and she does not have insurance. She is hoping to get insurance next month.      Past Medical History:  Diagnosis Date  . Anxiety   . Asthma   . Depression   . GERD (gastroesophageal reflux disease)     Patient Active Problem List   Diagnosis Date Noted  . Unspecified constipation 04/24/2012  . Pancreatitis, acute 04/19/2012  . Back pain 04/19/2012  . Abdominal pain, epigastric 04/19/2012  . GERD (gastroesophageal reflux disease)   . Asthma     Past Surgical History:  Procedure Laterality Date  . ESOPHAGOGASTRODUODENOSCOPY N/A 04/24/2012   Procedure: ESOPHAGOGASTRODUODENOSCOPY (EGD);  Surgeon: Juanita Craver, MD;  Location: [facility];  Service: Endoscopy;  Laterality: N/A;  . NO PAST SURGERIES      OB History    No data available       Home Medications    Prior  to Admission medications   Medication Sig Start Date End Date Taking? Authorizing Provider  predniSONE (DELTASONE) 20 MG tablet Two daily with food 10/26/15   Robyn Haber, MD    Family History Family History  Problem Relation Age of Onset  . Cancer Other   . Rheum arthritis Other     Social History Social History  Substance Use Topics  . Smoking status: Never Smoker  . Smokeless tobacco: Never Used  . Alcohol use 0.0 - 0.6 oz/week     Allergies   Review of patient's allergies indicates no known allergies.   Review of Systems Review of Systems  Constitutional: Negative for activity change, appetite change, fever and unexpected weight change.  HENT: Negative.   Eyes: Negative.   Respiratory: Positive for cough.   Cardiovascular: Positive for chest pain.  Gastrointestinal: Negative.   Genitourinary: Negative.   Musculoskeletal: Negative.   Neurological: Negative.   Psychiatric/Behavioral: Negative.      Physical Exam Triage Vital Signs ED Triage Vitals [10/26/15 1601]  Enc Vitals Group     BP (!) 117/54     Pulse Rate 76     Resp 12     Temp 98.6 F (37 C)     Temp Source Oral     SpO2 100 %  Weight      Height      Head Circumference      Peak Flow      Pain Score      Pain Loc      Pain Edu?      Excl. in [place]?    No data found.   Updated Vital Signs BP (!) 117/54 (BP Location: Right Arm)   Pulse 76   Temp 98.6 F (37 C) (Oral)   Resp 12   LMP 09/27/2015   SpO2 100%      Physical Exam  Constitutional: She is oriented to person, place, and time. She appears well-developed and well-nourished.  HENT:  Head: Normocephalic.  Right Ear: External ear normal.  Left Ear: External ear normal.  Mouth/Throat: Oropharynx is clear and moist.  Eyes: Conjunctivae and EOM are normal. Pupils are equal, round, and reactive to light.  Neck: Normal range of motion. Neck supple. No thyromegaly present.  Cardiovascular: Normal rate, regular rhythm and  normal heart sounds.   Pulmonary/Chest: Effort normal and breath sounds normal.  Tender with palpation left chest wall.  Musculoskeletal: Normal range of motion. She exhibits no edema, tenderness or deformity.  Lymphadenopathy:    She has no cervical adenopathy.  Neurological: She is alert and oriented to person, place, and time. No cranial nerve deficit.  Skin: Skin is warm and dry.  Psychiatric: She has a normal mood and affect. Her behavior is normal. Judgment and thought content normal.  Nursing note and vitals reviewed.    UC Treatments / Results  Labs (all labs ordered are listed, but only abnormal results are displayed) Labs Reviewed - No data to display  EKG  EKG Interpretation None       Radiology No results found.  Procedures Procedures (including critical care time)  Medications Ordered in UC Medications - No data to display   Initial Impression / Assessment and Plan / UC Course  I have reviewed the triage vital signs and the nursing notes.  Pertinent labs & imaging results that were available during my care of the patient were reviewed by me and considered in my medical decision making (see chart for details).  Clinical Course      Final Clinical Impressions(s) / UC Diagnoses   Final diagnoses:  Chest wall pain    New Prescriptions New Prescriptions   PREDNISONE (DELTASONE) 20 MG TABLET    Two daily with food     Robyn Haber, MD 10/26/15 [phone]

## 2015-10-26 NOTE — Discharge Instructions (Signed)
Your symptoms could represent a rheumatic type disease process. I'm giving you a medicine that should reduce inflammation.  You'll need further tests but I defer these until you have insurance.

## 2015-10-26 NOTE — ED Triage Notes (Signed)
Patient reports pain on left side of body for two years. Left chest/ribcage, left hip, down leg to heel.

## 2015-12-07 ENCOUNTER — Encounter (HOSPITAL_COMMUNITY): Payer: Self-pay | Admitting: Emergency Medicine

## 2015-12-07 ENCOUNTER — Ambulatory Visit (HOSPITAL_COMMUNITY)
Admission: EM | Admit: 2015-12-07 | Discharge: 2015-12-07 | Disposition: A | Discharge status: Home / Self Care | Payer: Self-pay | Attending: Family Medicine | Admitting: Family Medicine

## 2015-12-07 DIAGNOSIS — R1011 Right upper quadrant pain: Secondary | ICD-10-CM

## 2015-12-07 DIAGNOSIS — J45909 Unspecified asthma, uncomplicated: Secondary | ICD-10-CM | POA: Insufficient documentation

## 2015-12-07 DIAGNOSIS — R1032 Left lower quadrant pain: Secondary | ICD-10-CM | POA: Insufficient documentation

## 2015-12-07 DIAGNOSIS — Z79899 Other long term (current) drug therapy: Secondary | ICD-10-CM | POA: Insufficient documentation

## 2015-12-07 DIAGNOSIS — R1013 Epigastric pain: Secondary | ICD-10-CM | POA: Insufficient documentation

## 2015-12-07 LAB — URINALYSIS, ROUTINE W REFLEX MICROSCOPIC
BILIRUBIN URINE: NEGATIVE
GLUCOSE, UA: NEGATIVE mg/dL
Ketones, ur: NEGATIVE mg/dL
Leukocytes, UA: NEGATIVE
NITRITE: NEGATIVE
PH: 5 (ref 5.0–8.0)
Protein, ur: NEGATIVE mg/dL
SPECIFIC GRAVITY, URINE: 1.018 (ref 1.005–1.030)

## 2015-12-07 LAB — URINE MICROSCOPIC-ADD ON

## 2015-12-07 LAB — COMPREHENSIVE METABOLIC PANEL
ALBUMIN: 4.1 g/dL (ref 3.5–5.0)
ALT: 12 U/L — ABNORMAL LOW (ref 14–54)
ANION GAP: 7 (ref 5–15)
AST: 14 U/L — AB (ref 15–41)
Alkaline Phosphatase: 52 U/L (ref 38–126)
BUN: 10 mg/dL (ref 6–20)
CHLORIDE: 107 mmol/L (ref 101–111)
CO2: 26 mmol/L (ref 22–32)
Calcium: 9.7 mg/dL (ref 8.9–10.3)
Creatinine, Ser: 1.04 mg/dL — ABNORMAL HIGH (ref 0.44–1.00)
GFR calc Af Amer: >60 mL/min (ref >60)
GFR calc non Af Amer: >60 mL/min (ref >60)
GLUCOSE: 93 mg/dL (ref 65–99)
POTASSIUM: 4.5 mmol/L (ref 3.5–5.1)
SODIUM: 140 mmol/L (ref 135–145)
Total Bilirubin: 0.4 mg/dL (ref 0.3–1.2)
Total Protein: 7.4 g/dL (ref 6.5–8.1)

## 2015-12-07 LAB — CBC
HEMATOCRIT: 37.7 % (ref 36.0–46.0)
HEMOGLOBIN: 12.3 g/dL (ref 12.0–15.0)
MCH: 25.5 pg — AB (ref 26.0–34.0)
MCHC: 32.6 g/dL (ref 30.0–36.0)
MCV: 78.1 fL (ref 78.0–100.0)
Platelets: 296 10*3/uL (ref 150–400)
RBC: 4.83 MIL/uL (ref 3.87–5.11)
RDW: 14.8 % (ref 11.5–15.5)
WBC: 8.5 10*3/uL (ref 4.0–10.5)

## 2015-12-07 LAB — I-STAT BETA HCG BLOOD, ED (MC, WL, AP ONLY): I-stat hCG, quantitative: <5 m[IU]/mL (ref <5)

## 2015-12-07 LAB — LIPASE, BLOOD: Lipase: 24 U/L (ref 11–51)

## 2015-12-07 NOTE — ED Triage Notes (Signed)
Pt. Stated, I've had stomach pain today at 1230 and I had food at 1200.  I also feel like Im constipated.

## 2015-12-07 NOTE — ED Provider Notes (Signed)
[facility]    CSN: [id number] Arrival date & time: 12/07/15  1743     History   Chief Complaint Chief Complaint  Patient presents with  . Abdominal Pain  . Constipation    HPI Akashia Barbarito is a 25 y.o. female.   The history is provided by the patient.  Abdominal Pain  Pain location:  RUQ Pain quality: cramping   Pain radiates to:  Does not radiate Pain severity:  Moderate Onset quality:  Sudden Progression:  Worsening Chronicity:  New Context: not recent sexual activity, not sick contacts and not trauma   Relieved by:  Nothing Worsened by:  Nothing Ineffective treatments:  None tried Associated symptoms: anorexia and constipation   Associated symptoms: no diarrhea, no dysuria, no fever, no nausea, no vaginal bleeding, no vaginal discharge and no vomiting   Risk factors comment:  Not sexually active in over 37yr. Constipation  Associated symptoms: abdominal pain and anorexia   Associated symptoms: no diarrhea, no dysuria, no fever, no nausea and no vomiting     Past Medical History:  Diagnosis Date  . Anxiety   . Asthma   . Depression   . GERD (gastroesophageal reflux disease)     Patient Active Problem List   Diagnosis Date Noted  . Unspecified constipation 04/24/2012  . Pancreatitis, acute 04/19/2012  . Back pain 04/19/2012  . Abdominal pain, epigastric 04/19/2012  . GERD (gastroesophageal reflux disease)   . Asthma     Past Surgical History:  Procedure Laterality Date  . ESOPHAGOGASTRODUODENOSCOPY N/A 04/24/2012   Procedure: ESOPHAGOGASTRODUODENOSCOPY (EGD);  Surgeon: Juanita Craver, MD;  Location: [facility];  Service: Endoscopy;  Laterality: N/A;  . NO PAST SURGERIES      OB History    No data available       Home Medications    Prior to Admission medications   Medication Sig Start Date End Date Taking? Authorizing Provider  predniSONE (DELTASONE) 20 MG tablet Two daily with food 10/26/15   Robyn Haber, MD    Family  History Family History  Problem Relation Age of Onset  . Cancer Other   . Rheum arthritis Other     Social History Social History  Substance Use Topics  . Smoking status: Never Smoker  . Smokeless tobacco: Never Used  . Alcohol use 0.0 - 0.6 oz/week     Allergies   Patient has no known allergies.   Review of Systems Review of Systems  Constitutional: Negative.  Negative for fever.  Respiratory: Negative.   Gastrointestinal: Positive for abdominal pain, anorexia and constipation. Negative for diarrhea, nausea and vomiting.  Genitourinary: Negative.  Negative for dysuria, vaginal bleeding and vaginal discharge.  All other systems reviewed and are negative.    Physical Exam Triage Vital Signs ED Triage Vitals  Enc Vitals Group     BP 12/07/15 1808 (!) 114/38     Pulse Rate 12/07/15 1807 72     Resp 12/07/15 1807 17     Temp 12/07/15 1807 97.3 F (36.3 C)     Temp Source 12/07/15 1807 Oral     SpO2 12/07/15 1807 99 %     Weight 12/07/15 1808 240 lb (108.9 kg)     Height 12/07/15 1808 5' 4.5" (1.638 m)     Head Circumference --      Peak Flow --      Pain Score 12/07/15 1807 10     Pain Loc --      Pain Edu? --  Excl. in GC? --    No data found.   Updated Vital Signs BP (!) 114/38 (BP Location: Left Arm)   Pulse 72   Temp 97.3 F (36.3 C) (Oral)   Resp 17   Ht 5' 4.5" (1.638 m)   Wt 240 lb (108.9 kg)   LMP 12/03/2015   SpO2 99%   BMI 40.56 kg/m   Visual Acuity Right Eye Distance:   Left Eye Distance:   Bilateral Distance:    Right Eye Near:   Left Eye Near:    Bilateral Near:     Physical Exam  Constitutional: She is oriented to person, place, and time. She appears well-developed and well-nourished. She appears distressed.  Abdominal: Soft. She exhibits no mass. Bowel sounds are decreased. There is tenderness in the right upper quadrant and epigastric area. There is no rebound and no guarding. No hernia.  Neurological: She is alert and  oriented to person, place, and time.  Skin: Skin is warm and dry.  Nursing note and vitals reviewed.    UC Treatments / Results  Labs (all labs ordered are listed, but only abnormal results are displayed) Labs Reviewed - No data to display  EKG  EKG Interpretation None       Radiology No results found.  Procedures Procedures (including critical care time)  Medications Ordered in UC Medications - No data to display   Initial Impression / Assessment and Plan / UC Course  I have reviewed the triage vital signs and the nursing notes.  Pertinent labs & imaging results that were available during my care of the patient were reviewed by me and considered in my medical decision making (see chart for details).  Clinical Course       Final Clinical Impressions(s) / UC Diagnoses   Final diagnoses:  Abdominal pain, RUQ (right upper quadrant)    New Prescriptions New Prescriptions   No medications on file     Billy Fischer, MD 12/07/15 1836

## 2015-12-07 NOTE — ED Triage Notes (Signed)
Pt presents to ED for assessment of abdominal pain starting this morning after pt ate breakfast.  Pt sts pain has worsened throughout the night.  Pt c/o pain to the RUQ and the LLQ.  Pt c/o nausea and one episode of soft stool.  NAD at triage.

## 2015-12-08 ENCOUNTER — Emergency Department (HOSPITAL_COMMUNITY): Payer: Self-pay

## 2015-12-08 ENCOUNTER — Emergency Department (HOSPITAL_COMMUNITY)
Admission: EM | Admit: 2015-12-08 | Discharge: 2015-12-08 | Disposition: A | Discharge status: Home / Self Care | Payer: Self-pay | Attending: Emergency Medicine | Admitting: Emergency Medicine

## 2015-12-08 DIAGNOSIS — R1013 Epigastric pain: Secondary | ICD-10-CM

## 2015-12-08 DIAGNOSIS — R1032 Left lower quadrant pain: Secondary | ICD-10-CM

## 2015-12-08 LAB — GC/CHLAMYDIA PROBE AMP (~~LOC~~) NOT AT ARMC
CHLAMYDIA, DNA PROBE: NEGATIVE
NEISSERIA GONORRHEA: NEGATIVE

## 2015-12-08 LAB — WET PREP, GENITAL
Sperm: NONE SEEN
Trich, Wet Prep: NONE SEEN
YEAST WET PREP: NONE SEEN

## 2015-12-08 MED ORDER — OXYCODONE-ACETAMINOPHEN 5-325 MG PO TABS
1.0000 | ORAL_TABLET | Freq: Once | ORAL | Status: AC
  Administered 2015-12-08: 1 via ORAL
  Filled 2015-12-08: qty 1

## 2015-12-08 MED ORDER — ONDANSETRON 4 MG PO TBDP
4.0000 mg | ORAL_TABLET | Freq: Once | ORAL | Status: AC
  Administered 2015-12-08: 4 mg via ORAL
  Filled 2015-12-08: qty 1

## 2015-12-08 MED ORDER — OMEPRAZOLE 20 MG PO CPDR: 20.0000 mg | DELAYED_RELEASE_CAPSULE | Freq: Every day | ORAL | 0 refills | Status: DC

## 2015-12-08 MED ORDER — HYDROCODONE-ACETAMINOPHEN 5-325 MG PO TABS: 1.0000 | ORAL_TABLET | Freq: Four times a day (QID) | ORAL | 0 refills | Status: DC | PRN

## 2015-12-08 NOTE — Discharge Instructions (Signed)
You were seen today for abdominal pain. The cause of your pain at this time is unknown. Your workup is reassuring. Follow-up with her primary physician and gastroenterology if pain persists.

## 2015-12-08 NOTE — ED Provider Notes (Signed)
[facility] DEPT Provider Note   CSN: [id number] Arrival date & time: 12/07/15  1840  By signing my name below, I, Delton Prairie, attest that this documentation has been prepared under the direction and in the presence of Merryl Hacker, MD  Electronically Signed: Delton Prairie, ED Scribe. 12/08/15. 1:45 AM.  History   Chief Complaint Chief Complaint  Patient presents with  . Abdominal Pain   The history is provided by the patient. No language interpreter was used.   HPI Comments:  Laura Greene is a 25 y.o. female who presents to the Emergency Department complaining of "sharp 8/10" abdominal pain which began in the AM x 1 day. Reports that it is mostly in her left lower quadrant also has epigastric pain. Pt notes associated nausea. She denies vomiting, diarrhea, hematuria, dysuria, vaginal discharge, fevers, and abnormal bowel movements. Pt notes a hx of similar symtpoms 4 years ago possibly related to GERD. No alleviating factors noted. Pt denies any other symptoms or complaints at this time. Denies sexual activity or concerns for STDs  Past Medical History:  Diagnosis Date  . Anxiety   . Asthma   . Depression   . GERD (gastroesophageal reflux disease)     Patient Active Problem List   Diagnosis Date Noted  . Unspecified constipation 04/24/2012  . Pancreatitis, acute 04/19/2012  . Back pain 04/19/2012  . Abdominal pain, epigastric 04/19/2012  . GERD (gastroesophageal reflux disease)   . Asthma     Past Surgical History:  Procedure Laterality Date  . ESOPHAGOGASTRODUODENOSCOPY N/A 04/24/2012   Procedure: ESOPHAGOGASTRODUODENOSCOPY (EGD);  Surgeon: Juanita Craver, MD;  Location: [facility];  Service: Endoscopy;  Laterality: N/A;  . NO PAST SURGERIES      OB History    No data available       Home Medications    Prior to Admission medications   Medication Sig Start Date End Date Taking? Authorizing Provider  HYDROcodone-acetaminophen (NORCO/VICODIN) 5-325 MG  tablet Take 1 tablet by mouth every 6 (six) hours as needed. 12/08/15   Merryl Hacker, MD  omeprazole (PRILOSEC) 20 MG capsule Take 1 capsule (20 mg total) by mouth daily. 12/08/15   Merryl Hacker, MD  predniSONE (DELTASONE) 20 MG tablet Two daily with food Patient not taking: Reported on 12/08/2015 10/26/15   Robyn Haber, MD    Family History Family History  Problem Relation Age of Onset  . Cancer Other   . Rheum arthritis Other     Social History Social History  Substance Use Topics  . Smoking status: Never Smoker  . Smokeless tobacco: Never Used  . Alcohol use 0.0 - 0.6 oz/week     Comment: socially     Allergies   Patient has no known allergies.   Review of Systems Review of Systems  Constitutional: Negative for fever.  Gastrointestinal: Positive for abdominal pain and nausea. Negative for diarrhea and vomiting.  Genitourinary: Negative for dysuria, hematuria and vaginal discharge.  All other systems reviewed and are negative.    Physical Exam Updated Vital Signs BP 115/68   Pulse (!) 54   Temp 98.2 F (36.8 C) (Oral)   Resp 16   LMP 12/03/2015   SpO2 94%   Physical Exam  Constitutional: She is oriented to person, place, and time. She appears well-developed and well-nourished. No distress.  Obese  HENT:  Head: Normocephalic and atraumatic.  Cardiovascular: Normal rate, regular rhythm and normal heart sounds.   Pulmonary/Chest: Effort normal and breath sounds normal. No  respiratory distress. She has no wheezes.  Abdominal: Soft. Bowel sounds are normal. She exhibits no mass. There is tenderness. There is no guarding.  Genitourinary: Vagina normal.  Genitourinary Comments: Moderate vaginal discharge, no cervical motion tenderness, left adnexal tenderness, no mass  Neurological: She is alert and oriented to person, place, and time.  Skin: Skin is warm and dry.  Psychiatric: She has a normal mood and affect.  Nursing note and vitals  reviewed.    ED Treatments / Results  DIAGNOSTIC STUDIES:  Oxygen Saturation is 99% on RA, normal by my interpretation.    COORDINATION OF CARE:  1:32 AM Discussed treatment plan with pt at bedside and pt agreed to plan.  Labs (all labs ordered are listed, but only abnormal results are displayed) Labs Reviewed  WET PREP, GENITAL - Abnormal; Notable for the following:       Result Value   Clue Cells Wet Prep HPF POC PRESENT (*)    WBC, Wet Prep HPF POC MODERATE (*)    All other components within normal limits  COMPREHENSIVE METABOLIC PANEL - Abnormal; Notable for the following:    Creatinine, Ser 1.04 (*)    AST 14 (*)    ALT 12 (*)    All other components within normal limits  CBC - Abnormal; Notable for the following:    MCH 25.5 (*)    All other components within normal limits  URINALYSIS, ROUTINE W REFLEX MICROSCOPIC (NOT AT [facility]) - Abnormal; Notable for the following:    Hgb urine dipstick TRACE (*)    All other components within normal limits  URINE MICROSCOPIC-ADD ON - Abnormal; Notable for the following:    Squamous Epithelial / LPF 0-5 (*)    Bacteria, UA FEW (*)    All other components within normal limits  LIPASE, BLOOD  I-STAT BETA HCG BLOOD, ED ([facility], [facility], [facility] ONLY)  GC/CHLAMYDIA PROBE AMP ([facility]) NOT AT [facility]    EKG  EKG Interpretation None       Radiology US Pelvis Complete  Result Date: 12/08/2015 CLINICAL DATA:  Left lower quadrant and midline pelvic pain EXAM: TRANSABDOMINAL AND TRANSVAGINAL ULTRASOUND OF PELVIS DOPPLER ULTRASOUND OF OVARIES TECHNIQUE: Both transabdominal and transvaginal ultrasound examinations of the pelvis were performed. Transabdominal technique was performed for global imaging of the pelvis including uterus, ovaries, adnexal regions, and pelvic cul-de-sac. It was necessary to proceed with endovaginal exam following the transabdominal exam to visualize the . Color and duplex Doppler ultrasound was utilized to evaluate blood  flow to the ovaries. COMPARISON:  None. FINDINGS: Uterus Measurements: 5.2 x 3.8 x 4.6 cm. No fibroids or other mass visualized. Endometrium Thickness: 9 mm.  No focal abnormality visualized. Right ovary Measurements: 3.5 x 2.3 x 2.5 cm. Normal appearance/no adnexal mass. Left ovary Measurements: 4.2 x 2.7 x 2.6 cm. Left ovarian cyst measures 1.6 x 1.3 x 1.2 cm. Pulsed Doppler evaluation of both ovaries demonstrates normal low-resistance arterial and venous waveforms. Other findings Trace free fluid. IMPRESSION: Normal pelvic ultrasound for age. Electronically Signed   By: Ulyses Jarred M.D.   On: 12/08/2015 02:59   [place] Art/ven Flow Abd Pelv Doppler  Result Date: 12/08/2015 CLINICAL DATA:  Left lower quadrant and midline pelvic pain EXAM: TRANSABDOMINAL AND TRANSVAGINAL ULTRASOUND OF PELVIS DOPPLER ULTRASOUND OF OVARIES TECHNIQUE: Both transabdominal and transvaginal ultrasound examinations of the pelvis were performed. Transabdominal technique was performed for global imaging of the pelvis including uterus, ovaries, adnexal regions, and pelvic cul-de-sac. It was necessary to  proceed with endovaginal exam following the transabdominal exam to visualize the . Color and duplex Doppler ultrasound was utilized to evaluate blood flow to the ovaries. COMPARISON:  None. FINDINGS: Uterus Measurements: 5.2 x 3.8 x 4.6 cm. No fibroids or other mass visualized. Endometrium Thickness: 9 mm.  No focal abnormality visualized. Right ovary Measurements: 3.5 x 2.3 x 2.5 cm. Normal appearance/no adnexal mass. Left ovary Measurements: 4.2 x 2.7 x 2.6 cm. Left ovarian cyst measures 1.6 x 1.3 x 1.2 cm. Pulsed Doppler evaluation of both ovaries demonstrates normal low-resistance arterial and venous waveforms. Other findings Trace free fluid. IMPRESSION: Normal pelvic ultrasound for age. Electronically Signed   By: Ulyses Jarred M.D.   On: 12/08/2015 02:59    Procedures Procedures (including critical care time)  Medications  Ordered in ED Medications  oxyCODONE-acetaminophen (PERCOCET/ROXICET) 5-325 MG per tablet 1 tablet (1 tablet Oral Given 12/08/15 0139)  ondansetron (ZOFRAN-ODT) disintegrating tablet 4 mg (4 mg Oral Given 12/08/15 0139)     Initial Impression / Assessment and Plan / ED Course  I have reviewed the triage vital signs and the nursing notes.  Pertinent labs & imaging results that were available during my care of the patient were reviewed by me and considered in my medical decision making (see chart for details).  Clinical Course     Patient presents with both left lower quadrant and epigastric pain. She has tenderness to palpation mostly of the left lower quadrant without rebound or guarding. She also has adnexal tenderness. Otherwise exam is benign. She is well-appearing. Lab work is reassuring. She denies sexual activity or concern for STD. Pelvic ultrasound obtained and shows no evidence of torsion. Abdomen is soft. Given reassuring exam, do not feel she requires further workup. Symptom control with omeprazole and a short course of Percocet. Follow-up with GI if symptoms persist.  After history, exam, and medical workup I feel the patient has been appropriately medically screened and is safe for discharge home. Pertinent diagnoses were discussed with the patient. Patient was given return precautions.   Final Clinical Impressions(s) / ED Diagnoses   Final diagnoses:  LLQ pain  LLQ abdominal pain  Epigastric pain    New Prescriptions Discharge Medication List as of 12/08/2015  3:43 AM    START taking these medications   Details  HYDROcodone-acetaminophen (NORCO/VICODIN) 5-325 MG tablet Take 1 tablet by mouth every 6 (six) hours as needed., Starting Tue 12/08/2015, Print    omeprazole (PRILOSEC) 20 MG capsule Take 1 capsule (20 mg total) by mouth daily., Starting Tue 12/08/2015, Print       I personally performed the services described in this documentation, which was scribed in my  presence. The recorded information has been reviewed and is accurate.     Merryl Hacker, MD 12/08/15 [phone]

## 2016-12-29 ENCOUNTER — Ambulatory Visit (INDEPENDENT_AMBULATORY_CARE_PROVIDER_SITE_OTHER): Payer: 59 | Admitting: Physician Assistant

## 2016-12-29 ENCOUNTER — Ambulatory Visit (INDEPENDENT_AMBULATORY_CARE_PROVIDER_SITE_OTHER): Payer: 59

## 2016-12-29 ENCOUNTER — Encounter: Payer: Self-pay | Admitting: Physician Assistant

## 2016-12-29 VITALS — BP 125/85 | HR 87 | Temp 97.8°F | Resp 16 | Ht 64.5 in | Wt 250.0 lb

## 2016-12-29 DIAGNOSIS — M25572 Pain in left ankle and joints of left foot: Secondary | ICD-10-CM

## 2016-12-29 DIAGNOSIS — S82892A Other fracture of left lower leg, initial encounter for closed fracture: Secondary | ICD-10-CM

## 2016-12-29 NOTE — Patient Instructions (Addendum)
  Please go to [facility]: [address] [place], [place] [id number].  You will see Dr. Mayer Camel Be there at 215 pm.  Please bring photo ID and insurance card.   The appointment is at 245pm, however be there at 215.   IF you received an x-ray today, you will receive an invoice from [facility]. Please contact [facility] at [phone] with questions or concerns regarding your invoice.   IF you received labwork today, you will receive an invoice from [facility]. Please contact [facility] at [phone] with questions or concerns regarding your invoice.   Our billing staff will not be able to assist you with questions regarding bills from these companies.  You will be contacted with the lab results as soon as they are available. The fastest way to get your results is to activate your My Chart account. Instructions are located on the last page of this paperwork. If you have not heard from [place] regarding the results in 2 weeks, please contact this office.

## 2016-12-29 NOTE — Progress Notes (Signed)
PRIMARY CARE AT [facility] [address], [place] [address] [phone]  Date:  12/29/2016   Name:  Laura Greene   DOB:  Apr 27, 1990   MRN:  [id number]  PCP:  Patient, No Pcp Per    History of Present Illness:  Laura Greene is a 26 y.o. female patient who presents to PCP with  Chief Complaint  Patient presents with  . Ankle Pain    left/ today. pt fell     20 minutes ago, patient was walking when she rolled her ankle, and felt a crack.  The pain was instant.  There is some swelling.  She was able to ambulate, and get here, but 5 minutes ago, she was unable to bear any weight.  She does not recall any specific injury to this left ankle.   No Known Allergies  Medication list has been reviewed and updated.  Current Outpatient Medications on File Prior to Visit  Medication Sig Dispense Refill  . predniSONE (DELTASONE) 20 MG tablet Two daily with food 10 tablet 0  . HYDROcodone-acetaminophen (NORCO/VICODIN) 5-325 MG tablet Take 1 tablet by mouth every 6 (six) hours as needed. (Patient not taking: Reported on 12/29/2016) 6 tablet 0  . omeprazole (PRILOSEC) 20 MG capsule Take 1 capsule (20 mg total) by mouth daily. (Patient not taking: Reported on 12/29/2016) 30 capsule 0   No current facility-administered medications on file prior to visit.     ROS ROS otherwise unremarkable unless listed above.  Physical Examination: BP 125/85   Pulse 87   Temp 97.8 F (36.6 C) (Oral)   Resp 16   Ht 5' 4.5" (1.638 m)   Wt 250 lb (113.4 kg)   LMP 12/19/2016   SpO2 98%   BMI 42.25 kg/m  Ideal Body Weight: Weight in (lb) to have BMI = 25: 147.6  Physical Exam  Constitutional: She is oriented to person, place, and time. She appears well-developed and well-nourished. No distress.  HENT:  Head: Normocephalic and atraumatic.  Right Ear: External ear normal.  Left Ear: External ear normal.  Eyes: Conjunctivae and EOM are normal. Pupils are equal, round, and reactive to light.   Cardiovascular: Normal rate.  Pulmonary/Chest: Effort normal. No respiratory distress.  Musculoskeletal:       Left ankle: She exhibits decreased range of motion (all planes) and swelling (at the lateral area of the ankle at this time). Tenderness. Lateral malleolus (posterior area of the lateral malleolus/ and anteiror tenderness.) tenderness found.  R  Neurological: She is alert and oriented to person, place, and time.  Skin: She is not diaphoretic.  Psychiatric: She has a normal mood and affect. Her behavior is normal.   Dg Ankle Complete Left  Result Date: 12/29/2016 CLINICAL DATA:  26 year old female status post inversion injury this morning with left ankle pain. EXAM: LEFT ANKLE COMPLETE - 3+ VIEW COMPARISON:  10/26/2016. FINDINGS: Transverse fracture through the lateral malleolus with overlying soft tissue swelling. Minimal to nondisplaced distal fragment. Unchanged small superimposed chronic ossific fragment adjacent to the tip of the lateral malleolus. Anterior soft tissue swelling. Mortise joint alignment maintained. Talar dome, distal tibia and calcaneus appears stable and intact. Visible left foot appears intact. IMPRESSION: 1. Transverse minimally to non-displaced fracture through the lateral malleolus with soft tissue swelling. 2. No other acute fracture. Small chronic ossific fragment at the tip of the lateral malleolus. Electronically Signed   By: Genevie Ann M.D.   On: 12/29/2016 12:02    Assessment and Plan: Laura Greene  is a 26 y.o. female who is here today for cc of  Chief Complaint  Patient presents with  . Ankle Pain    left/ today. pt fell  crutches and cam walker placed. Reviewed by Dr. Linna Darner.  There is concern of the widening and difficulty with closure of this fracture.   Referral graciously accepted by Dr. Mayer Camel today.   Cd of fracture given. Closed fracture of malleolus of left ankle, initial encounter - Plan: AMB referral to orthopedics  Acute left ankle pain  - Plan: DG Ankle Complete Left, AMB referral to orthopedics  Ivar Drape, PA-C [facility] 11/29/20181:20 PM

## 2016-12-30 ENCOUNTER — Telehealth: Payer: Self-pay

## 2016-12-30 NOTE — Telephone Encounter (Signed)
Copied from [place] [id number]. Topic: Inquiry >> Dec 30, 2016 10:59 AM Oliver Pila B wrote: Reason for CRM: Tiffany from [employer] is needing information about the visit that the pt had on 11.29.18, contact Tiffany directly @ [phone]

## 2017-01-02 ENCOUNTER — Other Ambulatory Visit (HOSPITAL_COMMUNITY): Payer: Self-pay | Admitting: Orthopaedic Surgery

## 2017-01-02 DIAGNOSIS — M79605 Pain in left leg: Secondary | ICD-10-CM

## 2017-01-02 DIAGNOSIS — M7989 Other specified soft tissue disorders: Principal | ICD-10-CM

## 2017-01-03 ENCOUNTER — Ambulatory Visit (HOSPITAL_COMMUNITY)
Admission: RE | Admit: 2017-01-03 | Discharge: 2017-01-03 | Disposition: A | Discharge status: Home / Self Care | Payer: Worker's Compensation | Source: Ambulatory Visit | Attending: Orthopaedic Surgery | Admitting: Orthopaedic Surgery

## 2017-01-03 DIAGNOSIS — M7989 Other specified soft tissue disorders: Secondary | ICD-10-CM

## 2017-01-03 DIAGNOSIS — M79605 Pain in left leg: Secondary | ICD-10-CM

## 2017-01-03 NOTE — Progress Notes (Signed)
LLE venous duplex prelim: negative for DVT. Landry Mellow, RDMS, RVT  Called results to [facility].

## 2017-05-10 ENCOUNTER — Encounter: Payer: Self-pay | Admitting: Physician Assistant

## 2017-09-18 ENCOUNTER — Encounter (HOSPITAL_COMMUNITY): Payer: Self-pay | Admitting: Emergency Medicine

## 2017-09-18 ENCOUNTER — Emergency Department (HOSPITAL_COMMUNITY): Payer: 59

## 2017-09-18 ENCOUNTER — Emergency Department (HOSPITAL_COMMUNITY)
Admission: EM | Admit: 2017-09-18 | Discharge: 2017-09-19 | Disposition: A | Discharge status: Home / Self Care | Payer: 59 | Attending: Emergency Medicine | Admitting: Emergency Medicine

## 2017-09-18 ENCOUNTER — Other Ambulatory Visit: Payer: Self-pay

## 2017-09-18 DIAGNOSIS — Z79899 Other long term (current) drug therapy: Secondary | ICD-10-CM | POA: Insufficient documentation

## 2017-09-18 DIAGNOSIS — N925 Other specified irregular menstruation: Secondary | ICD-10-CM | POA: Diagnosis not present

## 2017-09-18 DIAGNOSIS — R102 Pelvic and perineal pain: Secondary | ICD-10-CM | POA: Diagnosis not present

## 2017-09-18 DIAGNOSIS — J45909 Unspecified asthma, uncomplicated: Secondary | ICD-10-CM | POA: Diagnosis not present

## 2017-09-18 DIAGNOSIS — D259 Leiomyoma of uterus, unspecified: Secondary | ICD-10-CM | POA: Diagnosis not present

## 2017-09-18 DIAGNOSIS — N938 Other specified abnormal uterine and vaginal bleeding: Secondary | ICD-10-CM | POA: Diagnosis present

## 2017-09-18 DIAGNOSIS — D219 Benign neoplasm of connective and other soft tissue, unspecified: Secondary | ICD-10-CM

## 2017-09-18 DIAGNOSIS — N926 Irregular menstruation, unspecified: Secondary | ICD-10-CM

## 2017-09-18 LAB — I-STAT BETA HCG BLOOD, ED (MC, WL, AP ONLY): I-stat hCG, quantitative: <5 m[IU]/mL (ref <5)

## 2017-09-18 LAB — COMPREHENSIVE METABOLIC PANEL
ALK PHOS: 55 U/L (ref 38–126)
ALT: 14 U/L (ref 0–44)
AST: 15 U/L (ref 15–41)
Albumin: 3.8 g/dL (ref 3.5–5.0)
Anion gap: 10 (ref 5–15)
BUN: 16 mg/dL (ref 6–20)
CALCIUM: 9.2 mg/dL (ref 8.9–10.3)
CHLORIDE: 110 mmol/L (ref 98–111)
CO2: 23 mmol/L (ref 22–32)
CREATININE: 0.96 mg/dL (ref 0.44–1.00)
GFR calc non Af Amer: >60 mL/min (ref >60)
Glucose, Bld: 89 mg/dL (ref 70–99)
Potassium: 3.6 mmol/L (ref 3.5–5.1)
SODIUM: 143 mmol/L (ref 135–145)
Total Bilirubin: 0.6 mg/dL (ref 0.3–1.2)
Total Protein: 7 g/dL (ref 6.5–8.1)

## 2017-09-18 LAB — CBC
HCT: 39.2 % (ref 36.0–46.0)
Hemoglobin: 12.4 g/dL (ref 12.0–15.0)
MCH: 25.8 pg — AB (ref 26.0–34.0)
MCHC: 31.6 g/dL (ref 30.0–36.0)
MCV: 81.7 fL (ref 78.0–100.0)
PLATELETS: 292 10*3/uL (ref 150–400)
RBC: 4.8 MIL/uL (ref 3.87–5.11)
RDW: 14.6 % (ref 11.5–15.5)
WBC: 5.2 10*3/uL (ref 4.0–10.5)

## 2017-09-18 LAB — URINALYSIS, ROUTINE W REFLEX MICROSCOPIC
BILIRUBIN URINE: NEGATIVE
GLUCOSE, UA: NEGATIVE mg/dL
HGB URINE DIPSTICK: NEGATIVE
Ketones, ur: NEGATIVE mg/dL
Leukocytes, UA: NEGATIVE
Nitrite: NEGATIVE
PROTEIN: NEGATIVE mg/dL
Specific Gravity, Urine: 1.021 (ref 1.005–1.030)
pH: 5 (ref 5.0–8.0)

## 2017-09-18 LAB — LIPASE, BLOOD: LIPASE: 40 U/L (ref 11–51)

## 2017-09-18 NOTE — ED Provider Notes (Signed)
[facility] EMER[facility]ENC[facility] DEPARTMENT Provider Note   CSN: [id number] Arrival date & time: 09/18/17  1938     History   Chief Complaint Chief Complaint  Patient presents with  . Vaginal Bleeding    HPI Laura Greene is a 27 y.o. female.  The history is provided by the patient and medical records.  Vaginal Bleeding  Primary symptoms include pelvic pain, vaginal bleeding.     27 year old female with history of anxiety, asthma, depression, GERD, presenting to the ED with irregular periods.  Reports usually her cycle comes on middle of the month and lasts about 3 to 4 days in total.  Reports over the past 2 months cycles have become increasingly longer, up to 8 days in length with slightly heavier flow.  Does report worsening of abdominal cramping and "swelling" in her pelvis during her cycle.  States during this time her pelvis is very tender to palpation.  She has not had any nausea, vomiting, or diarrhea.  No fever or chills.  She denies any new sexual partner or concern for STD.  No new vaginal discharge or urinary symptoms.  States she talked with her gynecologist about this and was told to "lose some weight".  States they were planning to get her in for an ultrasound to evaluate for possible ovarian cysts or other abnormalities, but states pain was worsening and she could not wait.  She has no known history of ovarian cyst.  No family history of ovarian cancer or other pathology that she is aware of.  Past Medical History:  Diagnosis Date  . Anxiety   . Asthma   . Depression   . GERD (gastroesophageal reflux disease)     Patient Active Problem List   Diagnosis Date Noted  . Unspecified constipation 04/24/2012  . Pancreatitis, acute 04/19/2012  . Back pain 04/19/2012  . Abdominal pain, epigastric 04/19/2012  . GERD (gastroesophageal reflux disease)   . Asthma     Past Surgical History:  Procedure Laterality Date  . ESOPHAGOGASTRODUODENOSCOPY N/A 04/24/2012    Procedure: ESOPHAGOGASTRODUODENOSCOPY (EGD);  Surgeon: Juanita Craver, MD;  Location: [facility];  Service: Endoscopy;  Laterality: N/A;  . NO PAST SURGERIES       OB History   None      Home Medications    Prior to Admission medications   Medication Sig Start Date End Date Taking? Authorizing Provider  ibuprofen (ADVIL,MOTRIN) 200 MG tablet Take 800 mg by mouth every 6 (six) hours as needed (for pain, cramping, or headaches).   Yes [provider]  naproxen (NAPROSYN) 500 MG tablet Take 500 mg by mouth daily as needed (for pain, cramping, or headaches).   Yes [provider]  escitalopram (LEXAPRO) 5 MG tablet Take 5 mg by mouth daily.    [provider]  HYDROcodone-acetaminophen (NORCO/VICODIN) 5-325 MG tablet Take 1 tablet by mouth every 6 (six) hours as needed. Patient not taking: Reported on 09/18/2017 12/08/15   Horton, Barbette Hair, MD  omeprazole (PRILOSEC) 20 MG capsule Take 1 capsule (20 mg total) by mouth daily. Patient not taking: Reported on 09/18/2017 12/08/15   Horton, Barbette Hair, MD  predniSONE (DELTASONE) 20 MG tablet Two daily with food Patient not taking: Reported on 09/18/2017 10/26/15   Robyn Haber, MD  VYVANSE 60 MG capsule Take 60 mg by mouth daily. 08/21/17   [provider]    Family History Family History  Problem Relation Age of Onset  . Cancer Other   .  Rheum arthritis Other     Social History Social History   Tobacco Use  . Smoking status: Never Smoker  . Smokeless tobacco: Never Used  Substance Use Topics  . Alcohol use: Yes    Alcohol/week: 0.0 - 1.0 standard drinks    Comment: socially  . Drug use: No     Allergies   Vyvanse [lisdexamfetamine]   Review of Systems Review of Systems  Genitourinary: Positive for pelvic pain and vaginal bleeding.  All other systems reviewed and are negative.    Physical Exam Updated Vital Signs BP 113/73   Pulse 70   Temp 98.4 F (36.9 C) (Oral)   Resp 18    Ht 5\' 5"  (1.651 m)   Wt 108.9 kg   SpO2 100%   BMI 39.94 kg/m   Physical Exam  Constitutional: She is oriented to person, place, and time. She appears well-developed and well-nourished.  HENT:  Head: Normocephalic and atraumatic.  Mouth/Throat: Oropharynx is clear and moist.  Abrasion noted to right side of neck  Eyes: Pupils are equal, round, and reactive to light. Conjunctivae and EOM are normal.  Neck: Normal range of motion.  Cardiovascular: Normal rate, regular rhythm and normal heart sounds.  Pulmonary/Chest: Effort normal and breath sounds normal. No stridor. No respiratory distress.  Abdominal: Soft. Bowel sounds are normal. There is no tenderness. There is no rebound.  Musculoskeletal: Normal range of motion.  Lymphadenopathy:  No lymphadenopathy noted on exam  Neurological: She is alert and oriented to person, place, and time.  Skin: Skin is warm and dry.  Psychiatric: She has a normal mood and affect.  Nursing note and vitals reviewed.    ED Treatments / Results  Labs (all labs ordered are listed, but only abnormal results are displayed) Labs Reviewed  CBC - Abnormal; Notable for the following components:      Result Value   MCH 25.8 (*)    All other components within normal limits  LIPASE, BLOOD  COMPREHENSIVE METABOLIC PANEL  URINALYSIS, ROUTINE W REFLEX MICROSCOPIC  I-STAT BETA HCG BLOOD, ED ([facility], [facility], [facility] ONLY)    EKG None  Radiology US Pelvis Transvanginal Non-ob (tv Only)  Result Date: 09/19/2017 CLINICAL DATA:  Initial evaluation for intermittent pelvic pain for 2 months. EXAM: TRANSABDOMINAL AND TRANSVAGINAL ULTRASOUND OF PELVIS DOPPLER ULTRASOUND OF OVARIES TECHNIQUE: Both transabdominal and transvaginal ultrasound examinations of the pelvis were performed. Transabdominal technique was performed for global imaging of the pelvis including uterus, ovaries, adnexal regions, and pelvic cul-de-sac. It was necessary to proceed with endovaginal exam following  the transabdominal exam to visualize the uterus, endometrium, and ovaries. Color and duplex Doppler ultrasound was utilized to evaluate blood flow to the ovaries. COMPARISON:  None. FINDINGS: Uterus Measurements: 6.9 x 3.7 x 4.6 cm. 2.1 x 2.0 x 1.4 cm intramural/submucosal fibroid present at the right posterior uterine body. Endometrium Thickness: 7.1 mm. No focal abnormality visualized. Small amount of free fluid noted within the endometrial canal at the lower uterine segment. Right ovary Measurements: 2.8 x 2.1 x 1.8 cm. Normal appearance/no adnexal mass. Left ovary Measurements: 3.1 x 2.1 x 2.0 cm. Normal appearance/no adnexal mass. Pulsed Doppler evaluation of both ovaries demonstrates normal low-resistance arterial and venous waveforms. Other findings No abnormal free fluid. IMPRESSION: 1. 2.1 cm intramural uterine fibroid. 2. Otherwise negative pelvic ultrasound. No evidence for torsion or other acute abnormality. Electronically Signed   By: Jeannine Boga M.D.   On: 09/19/2017 00:04   US Pelvis Complete  Result  Date: 09/19/2017 CLINICAL DATA:  Initial evaluation for intermittent pelvic pain for 2 months. EXAM: TRANSABDOMINAL AND TRANSVAGINAL ULTRASOUND OF PELVIS DOPPLER ULTRASOUND OF OVARIES TECHNIQUE: Both transabdominal and transvaginal ultrasound examinations of the pelvis were performed. Transabdominal technique was performed for global imaging of the pelvis including uterus, ovaries, adnexal regions, and pelvic cul-de-sac. It was necessary to proceed with endovaginal exam following the transabdominal exam to visualize the uterus, endometrium, and ovaries. Color and duplex Doppler ultrasound was utilized to evaluate blood flow to the ovaries. COMPARISON:  None. FINDINGS: Uterus Measurements: 6.9 x 3.7 x 4.6 cm. 2.1 x 2.0 x 1.4 cm intramural/submucosal fibroid present at the right posterior uterine body. Endometrium Thickness: 7.1 mm. No focal abnormality visualized. Small amount of free fluid  noted within the endometrial canal at the lower uterine segment. Right ovary Measurements: 2.8 x 2.1 x 1.8 cm. Normal appearance/no adnexal mass. Left ovary Measurements: 3.1 x 2.1 x 2.0 cm. Normal appearance/no adnexal mass. Pulsed Doppler evaluation of both ovaries demonstrates normal low-resistance arterial and venous waveforms. Other findings No abnormal free fluid. IMPRESSION: 1. 2.1 cm intramural uterine fibroid. 2. Otherwise negative pelvic ultrasound. No evidence for torsion or other acute abnormality. Electronically Signed   By: Jeannine Boga M.D.   On: 09/19/2017 00:04   US Pelvic Doppler (torsion R/o Or Mass Arterial Flow)  Result Date: 09/19/2017 CLINICAL DATA:  Initial evaluation for intermittent pelvic pain for 2 months. EXAM: TRANSABDOMINAL AND TRANSVAGINAL ULTRASOUND OF PELVIS DOPPLER ULTRASOUND OF OVARIES TECHNIQUE: Both transabdominal and transvaginal ultrasound examinations of the pelvis were performed. Transabdominal technique was performed for global imaging of the pelvis including uterus, ovaries, adnexal regions, and pelvic cul-de-sac. It was necessary to proceed with endovaginal exam following the transabdominal exam to visualize the uterus, endometrium, and ovaries. Color and duplex Doppler ultrasound was utilized to evaluate blood flow to the ovaries. COMPARISON:  None. FINDINGS: Uterus Measurements: 6.9 x 3.7 x 4.6 cm. 2.1 x 2.0 x 1.4 cm intramural/submucosal fibroid present at the right posterior uterine body. Endometrium Thickness: 7.1 mm. No focal abnormality visualized. Small amount of free fluid noted within the endometrial canal at the lower uterine segment. Right ovary Measurements: 2.8 x 2.1 x 1.8 cm. Normal appearance/no adnexal mass. Left ovary Measurements: 3.1 x 2.1 x 2.0 cm. Normal appearance/no adnexal mass. Pulsed Doppler evaluation of both ovaries demonstrates normal low-resistance arterial and venous waveforms. Other findings No abnormal free fluid. IMPRESSION:  1. 2.1 cm intramural uterine fibroid. 2. Otherwise negative pelvic ultrasound. No evidence for torsion or other acute abnormality. Electronically Signed   By: Jeannine Boga M.D.   On: 09/19/2017 00:04    Procedures Procedures (including critical care time)  Medications Ordered in ED Medications - No data to display   Initial Impression / Assessment and Plan / ED Course  I have reviewed the triage vital signs and the nursing notes.  Pertinent labs & imaging results that were available during my care of the patient were reviewed by me and considered in my medical decision making (see chart for details).  27 year old female here with irregular menstrual periods.  Reports over the past 2 months have been longer in duration, heavier flow, and worsening cramping.  Reports some "swelling" in her pelvis during her cycles.  She is afebrile and nontoxic.  Abdomen is soft and benign.  Lab work from triage are overall reassuring.  No new vaginal discharge, sexual partner, or concern for STD.  Plan with OB/GYN was to schedule for ultrasound so we will  obtain this here for further evaluation.  Ultrasound today shows uterine fibroids, no other acute findings.  This may account for some of her increased pain and irregular menses.  I recommended that she follow-up closely with her OB/GYN for ongoing management-- given copies of labs/imaging studies for physician review.  She will return here for any new/acute changes.  Final Clinical Impressions(s) / ED Diagnoses   Final diagnoses:  Pelvic pain  Irregular menses  Fibroids    ED Discharge Orders    None       Larene Pickett, PA-C 09/19/17 0116    Noemi Chapel, MD 09/20/17 1001

## 2017-09-18 NOTE — ED Notes (Signed)
Patient transported to Ultrasound 

## 2017-09-18 NOTE — ED Triage Notes (Addendum)
Pt has had increasingly long periods the past few months. Approximately lasting 2 weeks. Has some swelling noted to right pelvic area that comes and goes.  Has missed work d/t this Pt also endorses slight cough and swelling in her lymph notes. Nausea but no vomiting.

## 2017-09-19 NOTE — Discharge Instructions (Signed)
Follow-up closely with your OB-GYN for ongoing management. Return here for any new/acute changes.

## 2017-09-19 NOTE — ED Notes (Signed)
ED Provider at bedside. 

## 2017-09-19 NOTE — ED Notes (Signed)
Reviewed d/c instructions with pt, who verbalized understanding and had no outstanding questions. Pt departed in NAD, refused use of wheelchair.   

## 2017-11-06 ENCOUNTER — Other Ambulatory Visit: Payer: Self-pay | Admitting: Family Medicine

## 2017-11-06 DIAGNOSIS — R221 Localized swelling, mass and lump, neck: Secondary | ICD-10-CM

## 2017-11-06 DIAGNOSIS — R599 Enlarged lymph nodes, unspecified: Secondary | ICD-10-CM

## 2017-11-06 DIAGNOSIS — R591 Generalized enlarged lymph nodes: Secondary | ICD-10-CM

## 2017-11-17 ENCOUNTER — Ambulatory Visit
Admission: RE | Admit: 2017-11-17 | Discharge: 2017-11-17 | Disposition: A | Discharge status: Home / Self Care | Payer: 59 | Source: Ambulatory Visit | Attending: Family Medicine | Admitting: Family Medicine

## 2017-11-17 DIAGNOSIS — R591 Generalized enlarged lymph nodes: Secondary | ICD-10-CM

## 2017-11-17 DIAGNOSIS — R599 Enlarged lymph nodes, unspecified: Secondary | ICD-10-CM

## 2017-11-17 DIAGNOSIS — R221 Localized swelling, mass and lump, neck: Secondary | ICD-10-CM

## 2018-03-21 ENCOUNTER — Emergency Department (HOSPITAL_COMMUNITY)
Admission: EM | Admit: 2018-03-21 | Discharge: 2018-03-21 | Disposition: A | Discharge status: Home / Self Care | Payer: Self-pay | Attending: Emergency Medicine | Admitting: Emergency Medicine

## 2018-03-21 ENCOUNTER — Encounter (HOSPITAL_COMMUNITY): Payer: Self-pay | Admitting: *Deleted

## 2018-03-21 ENCOUNTER — Emergency Department (HOSPITAL_COMMUNITY): Payer: Self-pay

## 2018-03-21 DIAGNOSIS — M5432 Sciatica, left side: Secondary | ICD-10-CM | POA: Insufficient documentation

## 2018-03-21 DIAGNOSIS — M5430 Sciatica, unspecified side: Secondary | ICD-10-CM

## 2018-03-21 DIAGNOSIS — F329 Major depressive disorder, single episode, unspecified: Secondary | ICD-10-CM | POA: Insufficient documentation

## 2018-03-21 DIAGNOSIS — Z79899 Other long term (current) drug therapy: Secondary | ICD-10-CM | POA: Insufficient documentation

## 2018-03-21 DIAGNOSIS — J45909 Unspecified asthma, uncomplicated: Secondary | ICD-10-CM | POA: Insufficient documentation

## 2018-03-21 DIAGNOSIS — F419 Anxiety disorder, unspecified: Secondary | ICD-10-CM | POA: Insufficient documentation

## 2018-03-21 NOTE — ED Notes (Signed)
Patient transported to X-ray 

## 2018-03-21 NOTE — ED Triage Notes (Signed)
Pt in c/o left hip pain, states she was moving some stuff and now it hurts to walk on it, denies specific injury, ambulatory

## 2018-03-21 NOTE — ED Notes (Signed)
ED Provider at bedside. 

## 2018-03-21 NOTE — ED Provider Notes (Signed)
[facility] EM[facility] DEPART[facility] Provider Note   CSN: [id number] Arrival date & time: 03/21/18  1139    History   Chief Complaint Chief Complaint  Patient presents with  . Hip Pain    HPI Laura Greene is a 28 y.o. female.     HPI   28 year old female presents today with complaints of left hip and back pain.  Patient has a history of left hip pain, uncertain duration.  She notes that she had a fall in November where she fractured her left lateral ankle.  She notes, intermittent left hip and back pain sharp in nature.  She notes the pain is always present while laying down as she has to put a pillow under her knees.  She denies any loss of distal sensation strength or motor function.  She notes that when she sits for prolonged periods of time on the toilet she gets numbness in her left leg.  She denies any known trauma to her back.  No medications prior to arrival.   Past Medical History:  Diagnosis Date  . Anxiety   . Asthma   . Depression   . GERD (gastroesophageal reflux disease)     Patient Active Problem List   Diagnosis Date Noted  . Unspecified constipation 04/24/2012  . Pancreatitis, acute 04/19/2012  . Back pain 04/19/2012  . Abdominal pain, epigastric 04/19/2012  . GERD (gastroesophageal reflux disease)   . Asthma     Past Surgical History:  Procedure Laterality Date  . ESOPHAGOGASTRODUODENOSCOPY N/A 04/24/2012   Procedure: ESOPHAGOGASTRODUODENOSCOPY (EGD);  Surgeon: Juanita Craver, MD;  Location: [facility];  Service: Endoscopy;  Laterality: N/A;  . NO PAST SURGERIES       OB History   No obstetric history on file.      Home Medications    Prior to Admission medications   Medication Sig Start Date End Date Taking? Authorizing Provider  escitalopram (LEXAPRO) 5 MG tablet Take 5 mg by mouth daily.    [provider]  HYDROcodone-acetaminophen (NORCO/VICODIN) 5-325 MG tablet Take 1 tablet by mouth every 6 (six) hours as  needed. Patient not taking: Reported on 09/18/2017 12/08/15   Horton, Barbette Hair, MD  ibuprofen (ADVIL,MOTRIN) 200 MG tablet Take 800 mg by mouth every 6 (six) hours as needed (for pain, cramping, or headaches).    [provider]  naproxen (NAPROSYN) 500 MG tablet Take 500 mg by mouth daily as needed (for pain, cramping, or headaches).    [provider]  omeprazole (PRILOSEC) 20 MG capsule Take 1 capsule (20 mg total) by mouth daily. Patient not taking: Reported on 09/18/2017 12/08/15   Horton, Barbette Hair, MD  predniSONE (DELTASONE) 20 MG tablet Two daily with food Patient not taking: Reported on 09/18/2017 10/26/15   Robyn Haber, MD  VYVANSE 60 MG capsule Take 60 mg by mouth daily. 08/21/17   [provider]    Family History Family History  Problem Relation Age of Onset  . Cancer Other   . Rheum arthritis Other     Social History Social History   Tobacco Use  . Smoking status: Never Smoker  . Smokeless tobacco: Never Used  Substance Use Topics  . Alcohol use: Yes    Alcohol/week: 0.0 - 1.0 standard drinks    Comment: socially  . Drug use: No     Allergies   Vyvanse [lisdexamfetamine]   Review of Systems Review of Systems  All other systems reviewed and are negative.  Physical Exam Updated Vital Signs BP (!) 138/94   Pulse 78   Temp 98.4 F (36.9 C) (Oral)   Resp 16   LMP 03/02/2018 Comment: no chance of preg-per pt  SpO2 100%   Physical Exam Vitals signs and nursing note reviewed.  Constitutional:      Appearance: She is well-developed.  HENT:     Head: Normocephalic and atraumatic.  Eyes:     General: No scleral icterus.       Right eye: No discharge.        Left eye: No discharge.     Conjunctiva/sclera: Conjunctivae normal.     Pupils: Pupils are equal, round, and reactive to light.  Neck:     Musculoskeletal: Normal range of motion.     Vascular: No JVD.     Trachea: No tracheal deviation.  Pulmonary:     Effort:  Pulmonary effort is normal.     Breath sounds: No stridor.  Musculoskeletal:     Comments: Tenderness palpation of the left lateral lumbar musculature, no midline tenderness, tenderness palpation of the left gluteus, straight leg negative, distal sensation strength and motor function intact  Neurological:     Mental Status: She is alert and oriented to person, place, and time.     Coordination: Coordination normal.  Psychiatric:        Behavior: Behavior normal.        Thought Content: Thought content normal.        Judgment: Judgment normal.      ED Treatments / Results  Labs (all labs ordered are listed, but only abnormal results are displayed) Labs Reviewed - No data to display  EKG None  Radiology Dg Hip Unilat W Or Wo Pelvis 2-3 Views Left  Result Date: 03/21/2018 CLINICAL DATA:  Left hip pain EXAM: DG HIP (WITH OR WITHOUT PELVIS) 2-3V LEFT COMPARISON:  None. FINDINGS: There is no evidence of hip fracture or dislocation. There is no evidence of arthropathy or other focal bone abnormality. IMPRESSION: Negative. Electronically Signed   By: Franchot Gallo M.D.   On: 03/21/2018 13:06    Procedures Procedures (including critical care time)  Medications Ordered in ED Medications - No data to display   Initial Impression / Assessment and Plan / ED Course  I have reviewed the triage vital signs and the nursing notes.  Pertinent labs & imaging results that were available during my care of the patient were reviewed by me and considered in my medical decision making (see chart for details).        28 year old female here with sciatica type symptoms, no acute neurological deficits or red flags.  Discharged with outpatient follow-up and return precautions.  She verbalized understanding and agreement to today's plan.  Final Clinical Impressions(s) / ED Diagnoses   Final diagnoses:  Sciatic leg pain    ED Discharge Orders    None       Francee Gentile 03/21/18  1606    Carmin Muskrat, MD 03/22/18 1534

## 2018-03-21 NOTE — Discharge Instructions (Addendum)
Please read attached information. If you experience any new or worsening signs or symptoms please return to the emergency room for evaluation. Please follow-up with your primary care provider or specialist as discussed.  °

## 2018-03-21 NOTE — ED Notes (Signed)
Patient verbalizes understanding of discharge instructions. Opportunity for questioning and answers were provided. Armband removed by staff, pt discharged from ED. Pt ambulatory to lobby.  

## 2018-04-16 DIAGNOSIS — J019 Acute sinusitis, unspecified: Secondary | ICD-10-CM | POA: Diagnosis not present

## 2018-04-16 DIAGNOSIS — H6980 Other specified disorders of Eustachian tube, unspecified ear: Secondary | ICD-10-CM | POA: Diagnosis not present

## 2018-04-27 ENCOUNTER — Other Ambulatory Visit: Payer: Self-pay

## 2018-04-27 ENCOUNTER — Emergency Department (HOSPITAL_COMMUNITY): Payer: BLUE CROSS/BLUE SHIELD

## 2018-04-27 ENCOUNTER — Encounter (HOSPITAL_COMMUNITY): Payer: Self-pay

## 2018-04-27 ENCOUNTER — Emergency Department (HOSPITAL_COMMUNITY)
Admission: EM | Admit: 2018-04-27 | Discharge: 2018-04-28 | Discharge status: Against Medical Advice | Payer: BLUE CROSS/BLUE SHIELD | Attending: Emergency Medicine | Admitting: Emergency Medicine

## 2018-04-27 DIAGNOSIS — Z79899 Other long term (current) drug therapy: Secondary | ICD-10-CM | POA: Diagnosis not present

## 2018-04-27 DIAGNOSIS — R0602 Shortness of breath: Secondary | ICD-10-CM | POA: Diagnosis not present

## 2018-04-27 DIAGNOSIS — R05 Cough: Secondary | ICD-10-CM | POA: Insufficient documentation

## 2018-04-27 DIAGNOSIS — J45909 Unspecified asthma, uncomplicated: Secondary | ICD-10-CM | POA: Diagnosis not present

## 2018-04-27 DIAGNOSIS — R059 Cough, unspecified: Secondary | ICD-10-CM

## 2018-04-27 LAB — BASIC METABOLIC PANEL
Anion gap: 9 (ref 5–15)
BUN: 13 mg/dL (ref 6–20)
CO2: 24 mmol/L (ref 22–32)
Calcium: 8.9 mg/dL (ref 8.9–10.3)
Chloride: 105 mmol/L (ref 98–111)
Creatinine, Ser: 1.07 mg/dL — ABNORMAL HIGH (ref 0.44–1.00)
GFR calc Af Amer: >60 mL/min (ref >60)
GFR calc non Af Amer: >60 mL/min (ref >60)
Glucose, Bld: 100 mg/dL — ABNORMAL HIGH (ref 70–99)
Potassium: 3.7 mmol/L (ref 3.5–5.1)
Sodium: 138 mmol/L (ref 135–145)

## 2018-04-27 LAB — CBC
HEMATOCRIT: 38.8 % (ref 36.0–46.0)
Hemoglobin: 11.9 g/dL — ABNORMAL LOW (ref 12.0–15.0)
MCH: 24.4 pg — ABNORMAL LOW (ref 26.0–34.0)
MCHC: 30.7 g/dL (ref 30.0–36.0)
MCV: 79.5 fL — ABNORMAL LOW (ref 80.0–100.0)
Platelets: 274 10*3/uL (ref 150–400)
RBC: 4.88 MIL/uL (ref 3.87–5.11)
RDW: 14.7 % (ref 11.5–15.5)
WBC: 6.8 10*3/uL (ref 4.0–10.5)
nRBC: 0 % (ref 0.0–0.2)

## 2018-04-27 LAB — GROUP A STREP BY PCR: Group A Strep by PCR: NOT DETECTED

## 2018-04-27 LAB — TROPONIN I: Troponin I: <0.03 ng/mL (ref <0.03)

## 2018-04-27 LAB — I-STAT BETA HCG BLOOD, ED (MC, WL, AP ONLY): I-stat hCG, quantitative: <5 m[IU]/mL (ref <5)

## 2018-04-27 MED ORDER — SODIUM CHLORIDE 0.9% FLUSH: 3.0000 mL | Freq: Once | INTRAVENOUS | Status: DC

## 2018-04-27 NOTE — Discharge Instructions (Addendum)
Your symptoms of mild cough and shortness of breath in the setting of CONFIRMED, KNOWN COVID exposure warrant a 14 day quarantine where you have no contact with others. If the exposure is confirmed to be negative, you are felt to come out of quarantine safely.   If you develop a fever, worsening SOB and/or cough, or new concerning symptom, you are welcome to return to the ED for re-evaluation.

## 2018-04-27 NOTE — ED Triage Notes (Signed)
Pt reports sore throat for last day.  Rapid strep ordered in triage. No redness noted at this time

## 2018-04-27 NOTE — ED Triage Notes (Signed)
Pt here with shortness of breath that began this morning. Called PCP who prescribed sinus allergy meds.  Pt states they had friend who has tested positive for Covid-19.  Pt states only symptoms now is shortness of breath. Cough was there this am but now gone.

## 2018-04-27 NOTE — ED Notes (Signed)
Pt refused to change into gown states" I only came here for the covid test"

## 2018-04-27 NOTE — ED Notes (Signed)
Pt has decided she will go to her doctor for a cxr. She wants to leave now.

## 2018-04-27 NOTE — ED Provider Notes (Signed)
[facility] EMERGENCY DEPARTMENT Provider Note   CSN: [id number] Arrival date & time: 04/27/18  2157    History   Chief Complaint Chief Complaint  Patient presents with   Shortness of Breath   Chest Pain    HPI Laura Greene is a 28 y.o. female.     Patient to ED with symptoms of SOB x 2-3 days, mild dry cough and sore throat. She reports being on antibiotics currently for sinus infection prescribed by her doctor 10 days ago for isolated symptom of frontal headache. No fever at any time over the last 2 weeks. She reports that she has been exposed to someone who tested positive for COVID. Exposure was 5 days ago. She learned of the positive test on social media and has not been able to talk to this person to confirm timeline of their symptoms and testing. She has a history of childhood asthma. No nausea, vomiting, significant congestion.   The history is provided by the patient. No language interpreter was used.  Shortness of Breath  Associated symptoms: chest pain, headaches (10 days ago - frontal - dx sinusitis by PCP) and sore throat   Associated symptoms: no abdominal pain and no fever   Chest Pain  Associated symptoms: headache (10 days ago - frontal - dx sinusitis by PCP) and shortness of breath   Associated symptoms: no abdominal pain and no fever     Past Medical History:  Diagnosis Date   Anxiety    Asthma    Depression    GERD (gastroesophageal reflux disease)     Patient Active Problem List   Diagnosis Date Noted   Unspecified constipation 04/24/2012   Pancreatitis, acute 04/19/2012   Back pain 04/19/2012   Abdominal pain, epigastric 04/19/2012   GERD (gastroesophageal reflux disease)    Asthma     Past Surgical History:  Procedure Laterality Date   ESOPHAGOGASTRODUODENOSCOPY N/A 04/24/2012   Procedure: ESOPHAGOGASTRODUODENOSCOPY (EGD);  Surgeon: Juanita Craver, MD;  Location: [facility] ENDOSCOPY;  Service: Endoscopy;  Laterality:  N/A;   NO PAST SURGERIES       OB History   No obstetric history on file.      Home Medications    Prior to Admission medications   Medication Sig Start Date End Date Taking? Authorizing Provider  escitalopram (LEXAPRO) 5 MG tablet Take 5 mg by mouth daily.    [provider]  HYDROcodone-acetaminophen (NORCO/VICODIN) 5-325 MG tablet Take 1 tablet by mouth every 6 (six) hours as needed. Patient not taking: Reported on 09/18/2017 12/08/15   Horton, Barbette Hair, MD  ibuprofen (ADVIL,MOTRIN) 200 MG tablet Take 800 mg by mouth every 6 (six) hours as needed (for pain, cramping, or headaches).    [provider]  naproxen (NAPROSYN) 500 MG tablet Take 500 mg by mouth daily as needed (for pain, cramping, or headaches).    [provider]  omeprazole (PRILOSEC) 20 MG capsule Take 1 capsule (20 mg total) by mouth daily. Patient not taking: Reported on 09/18/2017 12/08/15   Horton, Barbette Hair, MD  predniSONE (DELTASONE) 20 MG tablet Two daily with food Patient not taking: Reported on 09/18/2017 10/26/15   Robyn Haber, MD  VYVANSE 60 MG capsule Take 60 mg by mouth daily. 08/21/17   [provider]    Family History Family History  Problem Relation Age of Onset   Cancer Other    Rheum arthritis Other     Social History Social History   Tobacco Use  Smoking status: Never Smoker   Smokeless tobacco: Never Used  Substance Use Topics   Alcohol use: Yes    Alcohol/week: 0.0 - 1.0 standard drinks    Comment: socially   Drug use: No     Allergies   Vyvanse [lisdexamfetamine]   Review of Systems Review of Systems  Constitutional: Negative for chills and fever.  HENT: Positive for sore throat. Negative for congestion.   Respiratory: Positive for shortness of breath.   Cardiovascular: Positive for chest pain.  Gastrointestinal: Negative.  Negative for abdominal pain.  Musculoskeletal: Negative.  Negative for myalgias.  Skin: Negative.     Neurological: Positive for headaches (10 days ago - frontal - dx sinusitis by PCP).     Physical Exam Updated Vital Signs BP (!) 109/97    Pulse 85    Temp 97.8 F (36.6 C) (Oral)    Resp 19    Ht 5\' 4"  (1.626 m)    Wt 108.9 kg    LMP 04/10/2018 (Approximate)    SpO2 100%    BMI 41.20 kg/m   Physical Exam Vitals signs and nursing note reviewed.  Constitutional:      General: She is not in acute distress.    Appearance: She is well-developed. She is not ill-appearing or toxic-appearing.  HENT:     Head: Normocephalic.     Mouth/Throat:     Mouth: Mucous membranes are moist.     Comments: Oropharyngeal redness without swelling or exudates.  Neck:     Musculoskeletal: Normal range of motion and neck supple.  Cardiovascular:     Rate and Rhythm: Normal rate and regular rhythm.     Heart sounds: No murmur.  Pulmonary:     Effort: Pulmonary effort is normal.     Breath sounds: Normal breath sounds. No wheezing, rhonchi or rales.     Comments: Full breath sounds to all fields.  Abdominal:     General: Bowel sounds are normal.     Palpations: Abdomen is soft.     Tenderness: There is no abdominal tenderness. There is no guarding or rebound.  Musculoskeletal: Normal range of motion.  Skin:    General: Skin is warm and dry.     Findings: No rash.  Neurological:     Mental Status: She is alert and oriented to person, place, and time.      ED Treatments / Results  Labs (all labs ordered are listed, but only abnormal results are displayed) Labs Reviewed  BASIC METABOLIC PANEL - Abnormal; Notable for the following components:      Result Value   Glucose, Bld 100 (*)    Creatinine, Ser 1.07 (*)    All other components within normal limits  CBC - Abnormal; Notable for the following components:   Hemoglobin 11.9 (*)    MCV 79.5 (*)    MCH 24.4 (*)    All other components within normal limits  GROUP A STREP BY PCR  TROPONIN I  I-STAT BETA HCG BLOOD, ED ([facility], [facility], [facility] ONLY)     EKG EKG Interpretation  Date/Time:  Friday April 27 2018 22:12:38 EDT Ventricular Rate:  89 PR Interval:  184 QRS Duration: 82 QT Interval:  356 QTC Calculation: 433 R Axis:   62 Text Interpretation:  Normal sinus rhythm with sinus arrhythmia Normal ECG No prior ECG for comparison.  No STEMI Confirmed by Antony Blackbird [phone]) on 04/27/2018 10:32:42 PM   Radiology No results found.  Procedures Procedures (including critical care  time)  Medications Ordered in ED Medications  sodium chloride flush (NS) 0.9 % injection 3 mL (3 mLs Intravenous Not Given 04/27/18 2307)     Initial Impression / Assessment and Plan / ED Course  I have reviewed the triage vital signs and the nursing notes.  Pertinent labs & imaging results that were available during my care of the patient were reviewed by me and considered in my medical decision making (see chart for details).        Patient to ED with symptoms of SOB, cough, concerned for having COVID as she thinks she has been exposed to someone with the virus. This is unconfirmed.   The patient is very well appearing. No hypoxia, no fever, no tachycardia. She has an erythematous oropharynx without exudates. Doubt acute infection.  The patient declines CXR. Discussed that based on possible exposure and symptoms of SOB, although very mild, and cough, she will need to go home and quarantine for 14 days. If there is confirmation that there was NO exposure, she is felt safe to come out of quarantine.   These instructions were explained to the patient, both verbally and written. Symptoms that would warrant return to the ED were also discussed.   Final Clinical Impressions(s) / ED Diagnoses   Final diagnoses:  None   1. Cough 2. Possible COVID exposure, unconfirmed  ED Discharge Orders    None       Dennie Bible 38/18/40 3754    Delora Fuel, MD 36/06/77 2245

## 2018-05-07 DIAGNOSIS — J302 Other seasonal allergic rhinitis: Secondary | ICD-10-CM | POA: Diagnosis not present

## 2018-05-07 DIAGNOSIS — J343 Hypertrophy of nasal turbinates: Secondary | ICD-10-CM | POA: Diagnosis not present

## 2018-05-07 DIAGNOSIS — J0141 Acute recurrent pansinusitis: Secondary | ICD-10-CM | POA: Diagnosis not present

## 2018-05-15 ENCOUNTER — Ambulatory Visit (INDEPENDENT_AMBULATORY_CARE_PROVIDER_SITE_OTHER): Payer: Self-pay | Admitting: Orthopaedic Surgery

## 2018-05-27 DIAGNOSIS — M25572 Pain in left ankle and joints of left foot: Secondary | ICD-10-CM | POA: Diagnosis not present

## 2018-06-01 ENCOUNTER — Ambulatory Visit (INDEPENDENT_AMBULATORY_CARE_PROVIDER_SITE_OTHER): Payer: Worker's Compensation | Admitting: Orthopaedic Surgery

## 2018-06-01 ENCOUNTER — Ambulatory Visit (INDEPENDENT_AMBULATORY_CARE_PROVIDER_SITE_OTHER): Payer: Worker's Compensation

## 2018-06-01 ENCOUNTER — Other Ambulatory Visit: Payer: Self-pay

## 2018-06-01 ENCOUNTER — Encounter (INDEPENDENT_AMBULATORY_CARE_PROVIDER_SITE_OTHER): Payer: Self-pay | Admitting: Orthopaedic Surgery

## 2018-06-01 VITALS — Ht 65.0 in | Wt 240.0 lb

## 2018-06-01 DIAGNOSIS — M545 Low back pain: Secondary | ICD-10-CM

## 2018-06-01 DIAGNOSIS — G8929 Other chronic pain: Secondary | ICD-10-CM

## 2018-06-01 DIAGNOSIS — M25572 Pain in left ankle and joints of left foot: Secondary | ICD-10-CM

## 2018-06-01 NOTE — Progress Notes (Signed)
Office Visit Note/ IME    Patient: Laura Greene           Date of Birth: 1990/03/30           MRN: [id number] Visit Date: 06/01/2018              Requested by: [facility] @ [address], [place] [address] PCP: [employer] [employer] [place]   Assessment & Plan: Visit Diagnoses:  1. Pain in left ankle and joints of left foot   2. Chronic left-sided low back pain, unspecified whether sciatica present     Plan: I reviewed x-ray findings which showed complete healing of her fracture.  We will obtain an arthritis panel just to make sure she does not have a rheumatologic condition causing the symptoms she is she describes with severe pain.  New [employer] ASO supplied since her old one is torn and splint.  She can use this intermittently.  She can continue regular work I will call her with the results of her lab work.  We discussed exercise activity that she could do to work on weight loss that will not aggravate her ankle such as recumbent bike sitting bike core strengthening exercises etc.  She can use ice and elevation when she gets home from work.  At this point I do not recommend any additional diagnostic testing.  Image studies were reviewed with the patient including today's ankle x-rays.  Recommendations were discussed.  At this point I do not think she needs additional therapy activities.  Follow-Up Instructions: No follow-ups on file.   Orders:  Orders Placed This Encounter  Procedures  . XR Lumbar Spine 2-3 Views  . XR Ankle Complete Left   No orders of the defined types were placed in this encounter.     Procedures: No procedures performed   Clinical Data: No additional findings.   Subjective: Chief Complaint  Patient presents with  . Left Ankle - Pain    IME, OTJI 12/29/16  . Left Foot - Pain    IME-OTJI 12/29/16    HPI 28 year old female seen for an IME requested by insurance company he had previously been  treated by Dr. Brent Bulla at [place] for a left closed ankle fracture, fibular fracture Weber A from a slip and fall at work on 12/29/2016.  Patient was treated conservatively nonoperatively.  She has had CT scan performed with CT done on 03/02/2017 and another one done on 11/24/2017.  Latest scan showed continued interval healing of transverse distal fibular fracture and old remote evulsion fibular injury consistent with an old ankle sprain.  Patient states that she was seen in urgent care last Sunday due to increased foot swelling significant pain and great difficulty walking.  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  She has to walk from 1 building to another.  She was placed on prednisone followed by ibuprofen at the urgent care and states swelling is down.  She has been through physical therapy after treatment for fracture and completed this.  She states she is having ongoing pain in her back and was concerned that some of the lateral foot pain may be related to her back.  Patient states she has pain laterally over the ankle some pain anteriorly over the anterior ankle joint.  Increased pain with ambulation.  When she went to urgent care she states her pain was severe.  She is concerned  that she is having ongoing symptoms and is young and is worried that this will last and become a chronic problem.  Review of Systems positive past history of GERD, pancreatitis epigastric pain, back pain ,asthma.  Left ankle fracture 2018.  Increased BMI 39.9 otherwise negative as pertains HPI.   Objective: Vital Signs: Ht 5\' 5"  (1.651 m)   Wt 240 lb (108.9 kg)   BMI 39.94 kg/m   Physical Exam Constitutional:      Appearance: She is well-developed.  HENT:     Head: Normocephalic.     Right Ear: External ear normal.     Left Ear: External ear normal.  Eyes:     Pupils: Pupils are equal, round, and reactive to light.  Neck:     Thyroid: No thyromegaly.     Trachea: No  tracheal deviation.  Cardiovascular:     Rate and Rhythm: Normal rate.  Pulmonary:     Effort: Pulmonary effort is normal.  Abdominal:     Palpations: Abdomen is soft.  Skin:    General: Skin is warm and dry.  Neurological:     Mental Status: She is alert and oriented to person, place, and time.  Psychiatric:        Behavior: Behavior normal.     Ortho Exam patient is able to ambulate on both heels and toes.  No sciatic notch tenderness right or left.  Skin over the lumbar spine is normal.  Knees reach full extension.  She has some tenderness palpation of the anterior ankle joint without ankle effusion.  No tenderness over the deltoid ligament.  Subtalar motion is normal.  Some complaints of pain with palpation over the peroneal tendon sheath without enlargement.  Pain with palpation over the dorsum of the foot over the metatarsals for second third and fourth.  Great toe range of motion is normal.  No palpable dorsal osteophytes over the midfoot.  Anterior tib EHL is strong normal gastroc strength posterior tib peroneals are strong.  Negative popliteal compression test negative straight leg raising 90 degrees normal hip range of motion. Specialty Comments:  No specialty comments available.  Imaging: No results found.   PMFS History: Patient Active Problem List   Diagnosis Date Noted  . Unspecified constipation 04/24/2012  . Pancreatitis, acute 04/19/2012  . Back pain 04/19/2012  . Abdominal pain, epigastric 04/19/2012  . GERD (gastroesophageal reflux disease)   . Asthma    Past Medical History:  Diagnosis Date  . Anxiety   . Asthma   . Depression   . GERD (gastroesophageal reflux disease)     Family History  Problem Relation Age of Onset  . Cancer Other   . Rheum arthritis Other     Past Surgical History:  Procedure Laterality Date  . ESOPHAGOGASTRODUODENOSCOPY N/A 04/24/2012   Procedure: ESOPHAGOGASTRODUODENOSCOPY (EGD);  Surgeon: Juanita Craver, MD;  Location: [facility]  ENDOSCOPY;  Service: Endoscopy;  Laterality: N/A;  . NO PAST SURGERIES     Social History   Occupational History  . Not on file  Tobacco Use  . Smoking status: Never Smoker  . Smokeless tobacco: Never Used  Substance and Sexual Activity  . Alcohol use: Yes    Alcohol/week: 0.0 - 1.0 standard drinks    Comment: socially  . Drug use: No  . Sexual activity: Not on file

## 2018-06-04 LAB — SEDIMENTATION RATE: Sed Rate: 17 mm/h (ref 0–20)

## 2018-06-04 LAB — URIC ACID: Uric Acid, Serum: 5.3 mg/dL (ref 2.5–7.0)

## 2018-06-04 LAB — ANA: Anti Nuclear Antibody (ANA): NEGATIVE

## 2018-06-04 LAB — RHEUMATOID FACTOR: Rhuematoid fact SerPl-aCnc: <14 IU/mL (ref <14)

## 2018-06-08 ENCOUNTER — Ambulatory Visit (HOSPITAL_COMMUNITY)
Admission: EM | Admit: 2018-06-08 | Discharge: 2018-06-08 | Disposition: A | Discharge status: Home / Self Care | Payer: BLUE CROSS/BLUE SHIELD | Attending: Internal Medicine | Admitting: Internal Medicine

## 2018-06-08 ENCOUNTER — Other Ambulatory Visit: Payer: Self-pay

## 2018-06-08 DIAGNOSIS — H5712 Ocular pain, left eye: Secondary | ICD-10-CM | POA: Diagnosis not present

## 2018-06-08 DIAGNOSIS — G43009 Migraine without aura, not intractable, without status migrainosus: Secondary | ICD-10-CM

## 2018-06-08 DIAGNOSIS — R51 Headache: Secondary | ICD-10-CM | POA: Diagnosis not present

## 2018-06-08 MED ORDER — SUMATRIPTAN SUCCINATE 6 MG/0.5ML ~~LOC~~ SOLN
6.0000 mg | Freq: Once | SUBCUTANEOUS | Status: AC
  Administered 2018-06-08: 21:00:00 6 mg via SUBCUTANEOUS

## 2018-06-08 MED ORDER — DEXAMETHASONE SODIUM PHOSPHATE 10 MG/ML IJ SOLN
INTRAMUSCULAR | Status: AC
  Filled 2018-06-08: qty 1

## 2018-06-08 MED ORDER — SUMATRIPTAN SUCCINATE 6 MG/0.5ML ~~LOC~~ SOLN
SUBCUTANEOUS | Status: AC
  Filled 2018-06-08: qty 0.5

## 2018-06-08 MED ORDER — DEXAMETHASONE SODIUM PHOSPHATE 10 MG/ML IJ SOLN
10.0000 mg | Freq: Once | INTRAMUSCULAR | Status: AC
  Administered 2018-06-08: 10 mg via INTRAMUSCULAR

## 2018-06-08 MED ORDER — SUMATRIPTAN SUCCINATE 25 MG PO TABS: 25.0000 mg | ORAL_TABLET | Freq: Once | ORAL | 0 refills | Status: DC

## 2018-06-08 NOTE — ED Provider Notes (Signed)
[place]    CSN: [id number] Arrival date & time: 06/08/18  1941     History   Chief Complaint Chief Complaint  Patient presents with  . Eye Pain    HPI Laura Greene is a 28 y.o. female with a history of asthma, anxiety and recently treated for acute sinusitis comes to urgent care with complaints of left-sided headache with retro-orbital pain of 8 to 10 days duration.  Patient has a migraine which usually aborts with ibuprofen.  She has tried ibuprofen and Tylenol with no improvement in her symptoms.  Pain is severe.  Aggravated by movement and possibly light.  Pain is relieved when she lays down.  She denies any floaters in her visual field.  She has associated nausea but no vomiting.  No dizziness.  No numbness or tingling over her face.  Patient completed a course of Augmentin and clindamycin for sinusitis recently. HPI  Past Medical History:  Diagnosis Date  . Anxiety   . Asthma   . Depression   . GERD (gastroesophageal reflux disease)     Patient Active Problem List   Diagnosis Date Noted  . Pain in left ankle and joints of left foot 06/01/2018  . Unspecified constipation 04/24/2012  . Pancreatitis, acute 04/19/2012  . Back pain 04/19/2012  . Abdominal pain, epigastric 04/19/2012  . GERD (gastroesophageal reflux disease)   . Asthma     Past Surgical History:  Procedure Laterality Date  . ESOPHAGOGASTRODUODENOSCOPY N/A 04/24/2012   Procedure: ESOPHAGOGASTRODUODENOSCOPY (EGD);  Surgeon: Juanita Craver, MD;  Location: [facility];  Service: Endoscopy;  Laterality: N/A;  . NO PAST SURGERIES      OB History   No obstetric history on file.      Home Medications    Prior to Admission medications   Medication Sig Start Date End Date Taking? Authorizing Provider  escitalopram (LEXAPRO) 5 MG tablet Take 5 mg by mouth daily.    [provider]  ibuprofen (ADVIL,MOTRIN) 200 MG tablet Take 800 mg by mouth every 6 (six) hours as needed (for pain,  cramping, or headaches).    [provider]  naproxen (NAPROSYN) 500 MG tablet Take 500 mg by mouth daily as needed (for pain, cramping, or headaches).    [provider]  predniSONE (DELTASONE) 10 MG tablet  05/31/18   [provider]  SUMAtriptan (IMITREX) 25 MG tablet Take 1 tablet (25 mg total) by mouth once for 1 dose. May repeat in 2 hours if headache persists or recurs. 06/08/18 06/08/18  LampteyMyrene Galas, MD  VYVANSE 60 MG capsule Take 60 mg by mouth daily. 08/21/17   [provider]    Family History Family History  Problem Relation Age of Onset  . Cancer Other   . Rheum arthritis Other     Social History Social History   Tobacco Use  . Smoking status: Never Smoker  . Smokeless tobacco: Never Used  Substance Use Topics  . Alcohol use: Yes    Alcohol/week: 0.0 - 1.0 standard drinks    Comment: socially  . Drug use: No     Allergies   Vyvanse [lisdexamfetamine]   Review of Systems Review of Systems  Constitutional: Negative for activity change, chills, fatigue and fever.  HENT: Positive for congestion and sinus pressure. Negative for postnasal drip, rhinorrhea, sinus pain, sore throat, tinnitus and voice change.   Eyes: Positive for pain. Negative for photophobia, discharge, redness, itching and visual disturbance.  Respiratory: Negative.   Cardiovascular:  Negative.   Gastrointestinal: Negative.   Endocrine: Negative.   Genitourinary: Negative.   Musculoskeletal: Negative.   Skin: Negative.   Neurological: Positive for dizziness. Negative for weakness, light-headedness and numbness.  Psychiatric/Behavioral: Negative for confusion and decreased concentration.  All other systems reviewed and are negative.    Physical Exam Triage Vital Signs ED Triage Vitals  Enc Vitals Group     BP 06/08/18 2009 107/73     Pulse Rate 06/08/18 2009 77     Resp 06/08/18 2009 16     Temp 06/08/18 2009 97.9 F (36.6 C)     Temp Source 06/08/18  2009 Oral     SpO2 06/08/18 2009 97 %     Weight --      Height --      Head Circumference --      Peak Flow --      Pain Score 06/08/18 2008 10     Pain Loc --      Pain Edu? --      Excl. in [place]? --    No data found.  Updated Vital Signs BP 107/73 (BP Location: Right Arm)   Pulse 77   Temp 97.9 F (36.6 C) (Oral)   Resp 16   SpO2 97%   Visual Acuity Right Eye Distance:   Left Eye Distance:   Bilateral Distance:    Right Eye Near:   Left Eye Near:    Bilateral Near:     Physical Exam Constitutional:      General: She is not in acute distress.    Appearance: Normal appearance. She is not ill-appearing.  HENT:     Head: Normocephalic and atraumatic.     Right Ear: Tympanic membrane normal.     Left Ear: Tympanic membrane normal.     Nose: Nose normal. No congestion or rhinorrhea.     Mouth/Throat:     Mouth: Mucous membranes are moist.     Pharynx: No oropharyngeal exudate or posterior oropharyngeal erythema.  Eyes:     General: No scleral icterus.    Extraocular Movements: Extraocular movements intact.     Pupils: Pupils are equal, round, and reactive to light.     Comments: Mild discomfort when the patient looks upward outwards.  Neck:     Musculoskeletal: Normal range of motion and neck supple.  Cardiovascular:     Rate and Rhythm: Normal rate and regular rhythm.     Pulses: Normal pulses.     Heart sounds: Normal heart sounds.  Pulmonary:     Effort: Pulmonary effort is normal.     Breath sounds: Normal breath sounds.  Abdominal:     General: Bowel sounds are normal.     Palpations: Abdomen is soft.  Skin:    General: Skin is warm.     Capillary Refill: Capillary refill takes less than 2 seconds.  Neurological:     General: No focal deficit present.     Mental Status: She is alert and oriented to person, place, and time.     Cranial Nerves: No cranial nerve deficit.     Sensory: No sensory deficit.     Motor: No weakness.      UC Treatments /  Results  Labs (all labs ordered are listed, but only abnormal results are displayed) Labs Reviewed - No data to display  EKG None  Radiology No results found.  Procedures Procedures (including critical care time)  Medications Ordered in UC Medications  dexamethasone (DECADRON) injection  10 mg (has no administration in time range)  SUMAtriptan (IMITREX) injection 6 mg (has no administration in time range)    Initial Impression / Assessment and Plan / UC Course  I have reviewed the triage vital signs and the nursing notes.  Pertinent labs & imaging results that were available during my care of the patient were reviewed by me and considered in my medical decision making (see chart for details).     1.  Recurrent migraine headaches: Patient currently has a migrainous headache Dexamethasone 10 mg IM Imitrex 6 mg subcutaneous Oral Imitrex to be prescribed at discharge If no improvement in symptoms patient may need to be reevaluated by ENT specialist and may need imaging to evaluate his sinuses. She may benefit from neurology referral if her symptoms are persistent. Final Clinical Impressions(s) / UC Diagnoses   Final diagnoses:  Migraine without aura and without status migrainosus, not intractable   Discharge Instructions   None    ED Prescriptions    Medication Sig Dispense Auth. Provider   SUMAtriptan (IMITREX) 25 MG tablet Take 1 tablet (25 mg total) by mouth once for 1 dose. May repeat in 2 hours if headache persists or recurs. 10 tablet , Myrene Galas, MD     Controlled Substance Prescriptions [place] Controlled Substance Registry consulted? No   Chase Picket, MD 06/08/18 2041

## 2018-06-08 NOTE — ED Triage Notes (Signed)
Per pt she was dx with respiratory infection has been on antibiotics for 2 months. Pressure behind the left eye and pressure in the left side of her face and severe pain. Pt said her head has been hurting for 8 to 10 day.

## 2018-06-12 ENCOUNTER — Ambulatory Visit: Payer: Self-pay | Admitting: *Deleted

## 2018-06-12 ENCOUNTER — Encounter (HOSPITAL_COMMUNITY): Payer: Self-pay | Admitting: *Deleted

## 2018-06-12 ENCOUNTER — Emergency Department (HOSPITAL_COMMUNITY)
Admission: EM | Admit: 2018-06-12 | Discharge: 2018-06-12 | Disposition: A | Discharge status: Home / Self Care | Payer: BLUE CROSS/BLUE SHIELD | Attending: Emergency Medicine | Admitting: Emergency Medicine

## 2018-06-12 ENCOUNTER — Emergency Department (HOSPITAL_COMMUNITY): Payer: BLUE CROSS/BLUE SHIELD

## 2018-06-12 ENCOUNTER — Other Ambulatory Visit: Payer: Self-pay

## 2018-06-12 DIAGNOSIS — M542 Cervicalgia: Secondary | ICD-10-CM

## 2018-06-12 DIAGNOSIS — R51 Headache: Secondary | ICD-10-CM | POA: Insufficient documentation

## 2018-06-12 DIAGNOSIS — H53149 Visual discomfort, unspecified: Secondary | ICD-10-CM | POA: Diagnosis not present

## 2018-06-12 DIAGNOSIS — Z1159 Encounter for screening for other viral diseases: Secondary | ICD-10-CM | POA: Insufficient documentation

## 2018-06-12 DIAGNOSIS — Z20828 Contact with and (suspected) exposure to other viral communicable diseases: Secondary | ICD-10-CM | POA: Diagnosis not present

## 2018-06-12 DIAGNOSIS — E236 Other disorders of pituitary gland: Secondary | ICD-10-CM

## 2018-06-12 DIAGNOSIS — R202 Paresthesia of skin: Secondary | ICD-10-CM | POA: Diagnosis not present

## 2018-06-12 DIAGNOSIS — R110 Nausea: Secondary | ICD-10-CM | POA: Insufficient documentation

## 2018-06-12 DIAGNOSIS — R519 Headache, unspecified: Secondary | ICD-10-CM

## 2018-06-12 DIAGNOSIS — G44209 Tension-type headache, unspecified, not intractable: Secondary | ICD-10-CM | POA: Diagnosis not present

## 2018-06-12 LAB — I-STAT BETA HCG BLOOD, ED (MC, WL, AP ONLY): I-stat hCG, quantitative: <5 m[IU]/mL (ref <5)

## 2018-06-12 LAB — CBC WITH DIFFERENTIAL/PLATELET
Abs Immature Granulocytes: 0.01 10*3/uL (ref 0.00–0.07)
Basophils Absolute: 0 10*3/uL (ref 0.0–0.1)
Basophils Relative: 1 %
Eosinophils Absolute: 0.2 10*3/uL (ref 0.0–0.5)
Eosinophils Relative: 3 %
HCT: 41.6 % (ref 36.0–46.0)
Hemoglobin: 13.2 g/dL (ref 12.0–15.0)
Immature Granulocytes: 0 %
Lymphocytes Relative: 30 %
Lymphs Abs: 1.8 10*3/uL (ref 0.7–4.0)
MCH: 25.5 pg — ABNORMAL LOW (ref 26.0–34.0)
MCHC: 31.7 g/dL (ref 30.0–36.0)
MCV: 80.5 fL (ref 80.0–100.0)
Monocytes Absolute: 0.5 10*3/uL (ref 0.1–1.0)
Monocytes Relative: 8 %
Neutro Abs: 3.4 10*3/uL (ref 1.7–7.7)
Neutrophils Relative %: 58 %
Platelets: 290 10*3/uL (ref 150–400)
RBC: 5.17 MIL/uL — ABNORMAL HIGH (ref 3.87–5.11)
RDW: 14.1 % (ref 11.5–15.5)
WBC: 5.8 10*3/uL (ref 4.0–10.5)
nRBC: 0 % (ref 0.0–0.2)

## 2018-06-12 LAB — BASIC METABOLIC PANEL
Anion gap: 7 (ref 5–15)
BUN: 10 mg/dL (ref 6–20)
CO2: 26 mmol/L (ref 22–32)
Calcium: 9.6 mg/dL (ref 8.9–10.3)
Chloride: 105 mmol/L (ref 98–111)
Creatinine, Ser: 0.99 mg/dL (ref 0.44–1.00)
GFR calc Af Amer: >60 mL/min (ref >60)
GFR calc non Af Amer: >60 mL/min (ref >60)
Glucose, Bld: 101 mg/dL — ABNORMAL HIGH (ref 70–99)
Potassium: 4 mmol/L (ref 3.5–5.1)
Sodium: 138 mmol/L (ref 135–145)

## 2018-06-12 LAB — C-REACTIVE PROTEIN: CRP: 3.4 mg/dL — ABNORMAL HIGH (ref <1.0)

## 2018-06-12 LAB — SEDIMENTATION RATE: Sed Rate: 12 mm/hr (ref 0–22)

## 2018-06-12 MED ORDER — DIPHENHYDRAMINE HCL 50 MG/ML IJ SOLN
25.0000 mg | Freq: Once | INTRAMUSCULAR | Status: AC
  Administered 2018-06-12: 25 mg via INTRAVENOUS
  Filled 2018-06-12: qty 1

## 2018-06-12 MED ORDER — SODIUM CHLORIDE 0.9 % IV BOLUS
1000.0000 mL | Freq: Once | INTRAVENOUS | Status: AC
  Administered 2018-06-12: 15:00:00 1000 mL via INTRAVENOUS

## 2018-06-12 MED ORDER — IOHEXOL 350 MG/ML SOLN
75.0000 mL | Freq: Once | INTRAVENOUS | Status: AC | PRN
  Administered 2018-06-12: 16:00:00 75 mL via INTRAVENOUS

## 2018-06-12 MED ORDER — DEXAMETHASONE SODIUM PHOSPHATE 10 MG/ML IJ SOLN
10.0000 mg | Freq: Once | INTRAMUSCULAR | Status: AC
  Administered 2018-06-12: 15:00:00 10 mg via INTRAVENOUS
  Filled 2018-06-12: qty 1

## 2018-06-12 MED ORDER — PROCHLORPERAZINE EDISYLATE 10 MG/2ML IJ SOLN
10.0000 mg | Freq: Once | INTRAMUSCULAR | Status: AC
  Administered 2018-06-12: 15:00:00 10 mg via INTRAVENOUS
  Filled 2018-06-12: qty 2

## 2018-06-12 NOTE — ED Notes (Addendum)
Dr. Leonides Schanz called regarding 3 visits patient had had for ongoing headache and fever. See fax info. R/o migraine, sinusitis/ Covid

## 2018-06-12 NOTE — ED Provider Notes (Addendum)
Care handoff received from [facility] PA-C at shift change please see her note for full details of visit.  In short patient with headache x12 days, left-sided neck and face tenderness.  No meningeal signs.  CRP was elevated however patient with recent fracture this felt to be nonspecific, low suspicion for temporal arteritis.  Remainder of lab work reassuring.  Plan at shift change is to await improvement of headache, follow-up on CT angiogram head and neck and then discharge.  Patient will also need send out COVID testing per PCP request. Physical Exam  BP 106/70 (BP Location: Left Arm)   Pulse 88   Temp 98 F (36.7 C) (Oral)   Resp 16   Ht 5\' 5"  (1.651 m)   Wt 108.9 kg   LMP 06/09/2018   SpO2 100%   BMI 39.94 kg/m   Physical Exam Constitutional:      General: She is not in acute distress.    Appearance: Normal appearance. She is well-developed. She is not ill-appearing or diaphoretic.  HENT:     Head: Normocephalic and atraumatic.     Right Ear: External ear normal.     Left Ear: External ear normal.     Nose: Nose normal.  Eyes:     General: Vision grossly intact. Gaze aligned appropriately.     Pupils: Pupils are equal, round, and reactive to light.  Neck:     Musculoskeletal: Normal range of motion.     Trachea: Trachea and phonation normal. No tracheal deviation.  Pulmonary:     Effort: Pulmonary effort is normal. No respiratory distress.  Abdominal:     General: There is no distension.     Palpations: Abdomen is soft.     Tenderness: There is no abdominal tenderness. There is no guarding or rebound.  Musculoskeletal: Normal range of motion.  Skin:    General: Skin is warm and dry.  Neurological:     Mental Status: She is alert.     GCS: GCS eye subscore is 4. GCS verbal subscore is 5. GCS motor subscore is 6.     Comments: Speech is clear and goal oriented, follows commands Major Cranial nerves without deficit, no facial droop Normal strength in upper and lower  extremities bilaterally including dorsiflexion and plantar flexion, strong and equal grip strength Sensation normal to light touch Moves extremities without ataxia, coordination intact Normal finger to nose and rapid alternating movements Neg romberg, no pronator drift Normal gait  Psychiatric:        Behavior: Behavior normal.    ED Course/Procedures   Clinical Course as of Jun 12 1850  Tue Jun 12, 2018  1556 Follow-up scans, if non-acute and improved h/a then discharge with pcp follow-up. If not improved then consult neuro. Add covid sendout per PCP request.   [BM]  8850 Discussed CT imaging with Dr. Leonel Ramsay who advises outpatient neurology follow-up.   [BM]    Clinical Course User Index [BM] Deliah Boston, PA-C    Procedures  MDM  Patient reevaluated sleeping easily arousable to voice no acute distress.  Ambulatory up and down hallway without assistance or difficulty.  No meningeal signs on reexamination.  She endorses complete resolution of her headache today and is requesting discharge.  CBC nonacute BMP nonacute CRP 3.4 as above feel very nonspecific doubt temporal arteritis in this 28 year old female Sed rate within normal limits Beta-hCG negative CT angios head neck:  IMPRESSION:  1. No evidence of acute intracranial abnormality.  2. No  evidence of major arterial occlusion, significant stenosis, or  aneurysm in the head and neck.  3. Suspected 9 mm pituitary mass. No further imaging evaluation or  follow-up is necessary. Consider endocrine function tests and  correlate for history of pituitary hypersecretion. This follows ACR  consensus guidelines: Management of Incidental Pituitary Findings on  CT, MRI and F18-FDG PET: A White Paper of the ACR Incidental  Findings Committee. J Am Coll Radiol 2018; 15: 384-66.   Discussed findings with Dr. Sherry Ruffing who advised neurology consult. - Discussed case and imaging with Dr. Leonel Ramsay who advises outpatient  neurology follow-up.  COVID-19 send out testing patient is asymptomatic regarding this at this time.  Patient advised to self quarantine x2 weeks and follow-up with PCP via tele-visit for recheck in 2-3 days.  She is aware to check her MyChart account for test results. - Patient updated on imaging findings and care plan she is agreeable to discharge at this time.  On reassessment patient is pain-free well-appearing and in no acute distress.  No meningeal signs on reexamination.  No neuro deficits on reexamination.  She states understanding to follow-up with neurology.  At this time there does not appear to be any evidence of an acute emergency medical condition and the patient appears stable for discharge with appropriate outpatient follow up. Diagnosis was discussed with patient who verbalizes understanding of care plan and is agreeable to discharge. I have discussed return precautions with patient who verbalizes understanding of return precautions. Patient encouraged to follow-up with their PCP and neuro. All questions answered.  Patient has been discharged in good condition.   Note: Portions of this report may have been transcribed using voice recognition software. Every effort was made to ensure accuracy; however, inadvertent computerized transcription errors may still be present.   Gari Crown 06/12/18 2139    Deliah Boston, PA-C 06/12/18 2141    Tegeler, Gwenyth Allegra, MD 06/13/18 [phone]

## 2018-06-12 NOTE — Telephone Encounter (Signed)
Patient is calling COVID line because she has been seen at ED and UC for headache and she is not getting better- her migraine treatment worked- but wore off and she is hurting again. Patient is desperate for help and thinks her headache may be COVID related. Patient is aware she will need an order for testing.Shestates her primary care office is closed today- put patient on hold and call to [facility]- patient transferred to office for scheduling today with provider at practice.  Reason for Disposition . [1] SEVERE headache (e.g., excruciating) AND [2] not improved after 2 hours of pain medicine  Answer Assessment - Initial Assessment Questions 1. LOCATION: "Where does it hurt?"      Eye to ear to back of head 2. ONSET: "When did the headache start?" (Minutes, hours or days)      2 weeks- patient has been out of work since last Tuesday- 5/1 3. PATTERN: "Does the pain come and go, or has it been constant since it started?"     Constant- patient has fever 1 day 4. SEVERITY: "How bad is the pain?" and "What does it keep you from doing?"  (e.g., Scale 1-10; mild, moderate, or severe)   - MILD (1-3): doesn't interfere with normal activities    - MODERATE (4-7): interferes with normal activities or awakens from sleep    - SEVERE (8-10): excruciating pain, unable to do any normal activities        Severe- face hurts- hurt to move 5. RECURRENT SYMPTOM: "Have you ever had headaches before?" If so, ask: "When was the last time?" and "What happened that time?"      Patient has had migraines before- but nothing like this 6. CAUSE: "What do you think is causing the headache?"     Patient thinks she has COVID because she has been to ED for this migraine treatment and it is not helping 7. MIGRAINE: "Have you been diagnosed with migraine headaches?" If so, ask: "Is this headache similar?"      Yes- mediation is not helping 8. HEAD INJURY: "Has there been any recent injury to the head?"      no 9. OTHER SYMPTOMS:  "Do you have any other symptoms?" (fever, stiff neck, eye pain, sore throat, cold symptoms)     Neck pain- 1 month, chest pain- respiratory infection history- trailing to headache, dry throat- outside of throat hurts 10. PREGNANCY: "Is there any chance you are pregnant?" "When was your last menstrual period?"       No- LMP- ended Sunday  Protocols used: HEADACHE-A-AH

## 2018-06-12 NOTE — ED Provider Notes (Signed)
[facility] EMERGENCY DEPARTMENT Provider Note   CSN: [id number] Arrival date & time: 06/12/18  1218    History   Chief Complaint Chief Complaint  Patient presents with  . Headache    HPI Laura Greene is a 28 y.o. female with history of asthma, GERD, depression, anxiety presents for evaluation of persistent left-sided headache for 12 days.  Reports that headache is sharp and throbbing in the frontal and retro-orbital region.  She notes associated nausea and photophobia.  Denies vomiting.  Also had intermittent left upper extremity paresthesias for approximately 1 hour yesterday.  The pain also radiates down the face to the left side of the neck.  She denies sore throat, difficulty breathing or swallowing, or nasal congestion.  No recent trauma.  No vision changes.  Apparently had a low-grade temp of 99.9 degrees at an orthopedist office where she was being seen for right ankle pain on 06/05/2023 no true documented fever otherwise.  She was seen at the urgent care 4 days ago on 06/08/2018 and received dexamethasone 10 mg IM and Imitrex 6 mg subcu with some improvement in her headache but recurrence shortly thereafter.  Oral sumatriptan has not been helpful.  Denies recent travel or any known sick contacts.  Return to her PCP today who sent her to the ED for further evaluation.     The history is provided by the patient.    Past Medical History:  Diagnosis Date  . Anxiety   . Asthma   . Depression   . GERD (gastroesophageal reflux disease)     Patient Active Problem List   Diagnosis Date Noted  . Pain in left ankle and joints of left foot 06/01/2018  . Unspecified constipation 04/24/2012  . Pancreatitis, acute 04/19/2012  . Back pain 04/19/2012  . Abdominal pain, epigastric 04/19/2012  . GERD (gastroesophageal reflux disease)   . Asthma     Past Surgical History:  Procedure Laterality Date  . ESOPHAGOGASTRODUODENOSCOPY N/A 04/24/2012   Procedure:  ESOPHAGOGASTRODUODENOSCOPY (EGD);  Surgeon: Juanita Craver, MD;  Location: [facility] ENDOSCOPY;  Service: Endoscopy;  Laterality: N/A;  . NO PAST SURGERIES       OB History   No obstetric history on file.      Home Medications    Prior to Admission medications   Medication Sig Start Date End Date Taking? Authorizing Provider  ibuprofen (ADVIL,MOTRIN) 200 MG tablet Take 800 mg by mouth every 6 (six) hours as needed for headache, moderate pain or cramping.    Yes [provider]  SUMAtriptan (IMITREX) 25 MG tablet Take 1 tablet (25 mg total) by mouth once for 1 dose. May repeat in 2 hours if headache persists or recurs. 06/08/18 06/12/18 Yes Lamptey, Myrene Galas, MD    Family History Family History  Problem Relation Age of Onset  . Cancer Other   . Rheum arthritis Other     Social History Social History   Tobacco Use  . Smoking status: Never Smoker  . Smokeless tobacco: Never Used  Substance Use Topics  . Alcohol use: Yes    Alcohol/week: 0.0 - 1.0 standard drinks    Comment: socially  . Drug use: No     Allergies   Vyvanse [lisdexamfetamine]   Review of Systems Review of Systems  Constitutional: Negative for chills and fever.  Eyes: Positive for photophobia. Negative for visual disturbance.  Respiratory: Negative for cough and shortness of breath.   Cardiovascular: Negative for chest pain.  Gastrointestinal: Positive for nausea.  Negative for abdominal pain and vomiting.  Musculoskeletal: Positive for neck pain.  Neurological: Positive for numbness and headaches. Negative for weakness.  All other systems reviewed and are negative.    Physical Exam Updated Vital Signs BP 106/70 (BP Location: Left Arm)   Pulse 88   Temp 98 F (36.7 C) (Oral)   Resp 16   Ht '5\' 5"'  (1.651 m)   Wt 108.9 kg   LMP 06/09/2018   SpO2 100%   BMI 39.94 kg/m   Physical Exam Vitals signs and nursing note reviewed.  Constitutional:      General: She is not in acute distress.     Appearance: She is well-developed. She is obese.  HENT:     Head: Normocephalic and atraumatic.  Eyes:     General:        Right eye: No discharge.        Left eye: No discharge.     Conjunctiva/sclera: Conjunctivae normal.  Neck:     Musculoskeletal: Normal range of motion and neck supple. No neck rigidity.     Vascular: No JVD.     Trachea: No tracheal deviation.     Comments: Diffuse tenderness to palpation of the left side of her neck anteriorly and posteriorly (in the trapezius distribution).  She has no limitation in range of motion but she does have pain with lateral rotation to the left. Cardiovascular:     Rate and Rhythm: Normal rate and regular rhythm.  Pulmonary:     Effort: Pulmonary effort is normal.     Breath sounds: Normal breath sounds.  Abdominal:     General: Bowel sounds are normal. There is no distension.     Palpations: Abdomen is soft.     Tenderness: There is no abdominal tenderness. There is no guarding.  Musculoskeletal: Normal range of motion.  Skin:    General: Skin is warm and dry.     Findings: No erythema.  Neurological:     Mental Status: She is alert and oriented to person, place, and time.     GCS: GCS eye subscore is 4. GCS verbal subscore is 5. GCS motor subscore is 6.     Cranial Nerves: No cranial nerve deficit or dysarthria.     Sensory: No sensory deficit.     Comments: Mental Status:  Alert, thought content appropriate, able to give a coherent history. Speech fluent without evidence of aphasia. Able to follow 2 step commands without difficulty.  Cranial Nerves:  II:  Peripheral visual fields grossly normal, pupils equal, round, reactive to light III,IV, VI: ptosis not present, extra-ocular motions intact bilaterally  V,VII: smile symmetric, facial light touch sensation equal VIII: hearing grossly normal to voice  X: uvula elevates symmetrically  XI: bilateral shoulder shrug symmetric and strong XII: midline tongue extension without  fassiculations Motor:  Normal tone. 5/5 strength of BUE and BLE major muscle groups including strong and equal grip strength and dorsiflexion/plantar flexion, no pronator drift Sensory: light touch normal in all extremities. Cerebellar: normal finger-to-nose with bilateral upper extremities, Romberg sign absent Gait: normal gait and balance. Able to walk on toes and heels with ease.    Psychiatric:        Behavior: Behavior normal.      ED Treatments / Results  Labs (all labs ordered are listed, but only abnormal results are displayed) Labs Reviewed  CBC WITH DIFFERENTIAL/PLATELET - Abnormal; Notable for the following components:      Result Value  RBC 5.17 (*)    MCH 25.5 (*)    All other components within normal limits  BASIC METABOLIC PANEL - Abnormal; Notable for the following components:   Glucose, Bld 101 (*)    All other components within normal limits  C-REACTIVE PROTEIN - Abnormal; Notable for the following components:   CRP 3.4 (*)    All other components within normal limits  NOVEL CORONAVIRUS, NAA (HOSPITAL ORDER, SEND-OUT TO REF LAB)  SEDIMENTATION RATE  I-STAT BETA HCG BLOOD, ED ([facility], [facility], [facility] ONLY)    EKG None  Radiology Ct Angio Head W Or Wo Contrast  Result Date: 06/12/2018 CLINICAL DATA:  Headache beginning 2 weeks ago. Facial pain extending into the neck, worse on the left. Nausea. EXAM: CT ANGIOGRAPHY HEAD AND NECK TECHNIQUE: Multidetector CT imaging of the head and neck was performed using the standard protocol during bolus administration of intravenous contrast. Multiplanar CT image reconstructions and MIPs were obtained to evaluate the vascular anatomy. Carotid stenosis measurements (when applicable) are obtained utilizing NASCET criteria, using the distal internal carotid diameter as the denominator. CONTRAST:  4m OMNIPAQUE IOHEXOL 350 MG/ML SOLN COMPARISON:  None. FINDINGS: CT HEAD FINDINGS Brain: There is no evidence of acute infarct, intracranial  hemorrhage, midline shift, or extra-axial fluid collection. The ventricles and sulci are normal. There is the suggestion of a 9 mm sellar mass. Vascular: No hyperdense vessel. Skull: No fracture or focal osseous lesion. Sinuses: Paranasal sinuses and mastoid air cells are clear. Orbits: Unremarkable. Review of the MIP images confirms the above findings CTA NECK FINDINGS Aortic arch: Standard 3 vessel aortic arch with widely patent arch vessel origins. Right carotid system: Patent and smooth without evidence of stenosis or dissection. Left carotid system: Patent and smooth without evidence of stenosis or dissection. Vertebral arteries: Patent and smooth without evidence of stenosis or dissection. Codominant. Skeleton: No acute osseous abnormality or suspicious osseous lesion. Other neck: No mass or enlarged lymph nodes identified. Upper chest: Clear lung apices. Review of the MIP images confirms the above findings CTA HEAD FINDINGS Anterior circulation: The internal carotid arteries are widely patent from skull base to carotid termini. ACAs and MCAs are patent without evidence of proximal branch occlusion or significant proximal stenosis. No aneurysm is identified. Posterior circulation: The intracranial vertebral arteries are widely patent to the basilar. Patent AICA and SCA origins are identified bilaterally. The basilar artery is widely patent. Posterior communicating arteries are diminutive or absent. PCAs are patent without evidence of significant proximal stenosis. No aneurysm is identified. Venous sinuses: Grossly patent on the delayed postcontrast head CT. Anatomic variants: None. Delayed phase: Apparent enhancement of the sellar mass. Review of the MIP images confirms the above findings IMPRESSION: 1. No evidence of acute intracranial abnormality. 2. No evidence of major arterial occlusion, significant stenosis, or aneurysm in the head and neck. 3. Suspected 9 mm pituitary mass. No further imaging evaluation  or follow-up is necessary. Consider endocrine function tests and correlate for history of pituitary hypersecretion. This follows ACR consensus guidelines: Management of Incidental Pituitary Findings on CT, MRI and F18-FDG PET: A White Paper of the ACR Incidental Findings Committee. J Am Coll Radiol 2018; 15:: 355-97 Electronically Signed   By: ALogan BoresM.D.   On: 06/12/2018 17:11   Ct Angio Neck W And/or Wo Contrast  Result Date: 06/12/2018 CLINICAL DATA:  Headache beginning 2 weeks ago. Facial pain extending into the neck, worse on the left. Nausea. EXAM: CT ANGIOGRAPHY HEAD AND NECK TECHNIQUE: Multidetector CT  imaging of the head and neck was performed using the standard protocol during bolus administration of intravenous contrast. Multiplanar CT image reconstructions and MIPs were obtained to evaluate the vascular anatomy. Carotid stenosis measurements (when applicable) are obtained utilizing NASCET criteria, using the distal internal carotid diameter as the denominator. CONTRAST:  81m OMNIPAQUE IOHEXOL 350 MG/ML SOLN COMPARISON:  None. FINDINGS: CT HEAD FINDINGS Brain: There is no evidence of acute infarct, intracranial hemorrhage, midline shift, or extra-axial fluid collection. The ventricles and sulci are normal. There is the suggestion of a 9 mm sellar mass. Vascular: No hyperdense vessel. Skull: No fracture or focal osseous lesion. Sinuses: Paranasal sinuses and mastoid air cells are clear. Orbits: Unremarkable. Review of the MIP images confirms the above findings CTA NECK FINDINGS Aortic arch: Standard 3 vessel aortic arch with widely patent arch vessel origins. Right carotid system: Patent and smooth without evidence of stenosis or dissection. Left carotid system: Patent and smooth without evidence of stenosis or dissection. Vertebral arteries: Patent and smooth without evidence of stenosis or dissection. Codominant. Skeleton: No acute osseous abnormality or suspicious osseous lesion. Other neck:  No mass or enlarged lymph nodes identified. Upper chest: Clear lung apices. Review of the MIP images confirms the above findings CTA HEAD FINDINGS Anterior circulation: The internal carotid arteries are widely patent from skull base to carotid termini. ACAs and MCAs are patent without evidence of proximal branch occlusion or significant proximal stenosis. No aneurysm is identified. Posterior circulation: The intracranial vertebral arteries are widely patent to the basilar. Patent AICA and SCA origins are identified bilaterally. The basilar artery is widely patent. Posterior communicating arteries are diminutive or absent. PCAs are patent without evidence of significant proximal stenosis. No aneurysm is identified. Venous sinuses: Grossly patent on the delayed postcontrast head CT. Anatomic variants: None. Delayed phase: Apparent enhancement of the sellar mass. Review of the MIP images confirms the above findings IMPRESSION: 1. No evidence of acute intracranial abnormality. 2. No evidence of major arterial occlusion, significant stenosis, or aneurysm in the head and neck. 3. Suspected 9 mm pituitary mass. No further imaging evaluation or follow-up is necessary. Consider endocrine function tests and correlate for history of pituitary hypersecretion. This follows ACR consensus guidelines: Management of Incidental Pituitary Findings on CT, MRI and F18-FDG PET: A White Paper of the ACR Incidental Findings Committee. J Am Coll Radiol 2018; 15:: 553-74 Electronically Signed   By: ALogan BoresM.D.   On: 06/12/2018 17:11    Procedures Procedures (including critical care time)  Medications Ordered in ED Medications  prochlorperazine (COMPAZINE) injection 10 mg (10 mg Intravenous Given 06/12/18 1505)  diphenhydrAMINE (BENADRYL) injection 25 mg (25 mg Intravenous Given 06/12/18 1508)  sodium chloride 0.9 % bolus 1,000 mL (0 mLs Intravenous Stopped 06/12/18 1812)  dexamethasone (DECADRON) injection 10 mg (10 mg  Intravenous Given 06/12/18 1506)  iohexol (OMNIPAQUE) 350 MG/ML injection 75 mL (75 mLs Intravenous Contrast Given 06/12/18 1622)     Initial Impression / Assessment and Plan / ED Course  I have reviewed the triage vital signs and the nursing notes.  Pertinent labs & imaging results that were available during my care of the patient were reviewed by me and considered in my medical decision making (see chart for details).   Patient presents sent from her PCP for evaluation of recurrent intractable headache.  She is afebrile, vital signs are stable.  She is nontoxic in appearance.  She has a non-focal neurologic examination, though exhibits some photophobia.  She does also  complain of left-sided neck pain reproducible on palpation.  No neck rigidity, no meningeal signs and no fever to suggest meningitis.  Also unlikely that she has bacterial meningitis given the duration of her symptoms. Doubt CVA, ICH, SAH, vertebral artery dissection, or venus sinus thrombosis. Will give migraine cocktail and obtain imaging and reassess.  Lab work reviewed by me shows no leukocytosis, no anemia, no metabolic derangement, no renal insufficiency.  Her CRP is elevated but ESR is within normal limits and she does have history of recent ankle fracture so this is a nonspecific finding.  Headache symptoms have improved.  4:20PM  Signed out to oncoming provider PA [place].  Pending imaging.  Depending on results, patient may require consultation to neurology.  If headache symptoms have improved and imaging is reassuring she is likely stable for discharge home with follow-up with a neurologist on outpatient basis for her recurrent headaches.  PCP also requested COVID testing which will be obtained after her disposition is reached (in order to decide in-house versus send-out test).   Final Clinical Impressions(s) / ED Diagnoses   Final diagnoses:  Bad headache  Neck pain on left side  Pituitary mass [facility])    ED  Discharge Orders    None       Debroah Baller 06/13/18 0729    Carmin Muskrat, MD 06/15/18 2257

## 2018-06-12 NOTE — Discharge Instructions (Addendum)
Alternate 400 mg of ibuprofen and 500 mg of Tylenol every 4 hours as needed for pain. Do not exceed 4000 mg of Tylenol daily.  Take ibuprofen with food to avoid upset stomach issues.  Drink plenty water and get plenty of rest.  Follow-up with your primary care physician or neurology as soon as possible on an outpatient basis for reevaluation of your headaches.  Return to emergency department immediately for any concerning signs or symptoms develop such as high fevers, worsening headache, persistent vomiting, vision changes, neck pain, weakness or onset of the body, difficulty breathing or swallowing. -------------------- As we discussed there was a small 9 mm pituitary mass seen on your CT scan.  We discussed this with our neurologist Dr. Leonel Ramsay who advises that you call neurology to schedule a follow-up appointment to discuss these findings.  You may call either [facility] or [facility] to schedule an appointment.  Call their office tomorrow.  Additionally your COVID-19 test will result in the next 2-3 days please check your MyChart account for results.  We advised that you self quarantine for the next 2 weeks to avoid potential spread return to emergency department for any new or worsening symptoms.  Follow-up with your primary care provider in 2-3 days via telephone for check-up. ==========  If you live with, or provide care at home for, a person confirmed to have, or being evaluated for, COVID-19 infection please follow these guidelines to prevent infection:  Follow healthcare providers instructions Make sure that you understand and can help the patient follow any healthcare provider instructions for all care.  Provide for the patients basic needs You should help the patient with basic needs in the home and provide support for getting groceries, prescriptions, and other personal needs.  Monitor the patients symptoms If they are getting sicker, call his or her medical  provider a  This will help the healthcare providers office take steps to keep other people from getting infected. Ask the healthcare provider to call the local or state health department.  Limit the number of people who have contact with the patient If possible, have only one caregiver for the patient. Other household members should stay in another home or place of residence. If this is not possible, they should stay in another room, or be separated from the patient as much as possible. Use a separate bathroom, if available. Restrict visitors who do not have an essential need to be in the home.  Keep older adults, very young children, and other sick people away from the patient Keep older adults, very young children, and those who have compromised immune systems or chronic health conditions away from the patient. This includes people with chronic heart, lung, or kidney conditions, diabetes, and cancer.  Ensure good ventilation Make sure that shared spaces in the home have good air flow, such as from an air conditioner or an opened window, weather permitting.  Wash your hands often Wash your hands often and thoroughly with soap and water for at least 20 seconds. You can use an alcohol based hand sanitizer if soap and water are not available and if your hands are not visibly dirty. Avoid touching your eyes, nose, and mouth with unwashed hands. Use disposable paper towels to dry your hands. If not available, use dedicated cloth towels and replace them when they become wet.  Wear a facemask and gloves Wear a disposable facemask at all times in the room and gloves when you touch or have contact with  the patients blood, body fluids, and/or secretions or excretions, such as sweat, saliva, sputum, nasal mucus, vomit, urine, or feces.  Ensure the mask fits over your nose and mouth tightly, and do not touch it during use. Throw out disposable facemasks and gloves after using them. Do not reuse. Wash  your hands immediately after removing your facemask and gloves. If your personal clothing becomes contaminated, carefully remove clothing and launder. Wash your hands after handling contaminated clothing. Place all used disposable facemasks, gloves, and other waste in a lined container before disposing them with other household waste. Remove gloves and wash your hands immediately after handling these items.  Do not share dishes, glasses, or other household items with the patient Avoid sharing household items. You should not share dishes, drinking glasses, cups, eating utensils, towels, bedding, or other items After the person uses these items, you should wash them thoroughly with soap and water.  Wash laundry thoroughly Immediately remove and wash clothes or bedding that have blood, body fluids, and/or secretions or excretions, such as sweat, saliva, sputum, nasal mucus, vomit, urine, or feces, on them. Wear gloves when handling laundry from the patient. Read and follow directions on labels of laundry or clothing items and detergent. In general, wash and dry with the warmest temperatures recommended on the label.  Clean all areas the individual has used often Clean all touchable surfaces, such as counters, tabletops, doorknobs, bathroom fixtures, toilets, phones, keyboards, tablets, and bedside tables, every day. Also, clean any surfaces that may have blood, body fluids, and/or secretions or excretions on them. Wear gloves when cleaning surfaces the patient has come in contact with. Use a diluted bleach solution (e.g., dilute bleach with 1 part bleach and 10 parts water) or a household disinfectant with a label that says EPA-registered for coronaviruses. To make a bleach solution at home, add 1 tablespoon of bleach to 1 quart (4 cups) of water. For a larger supply, add  cup of bleach to 1 gallon (16 cups) of water. Read labels of cleaning products and follow recommendations provided on product  labels. Labels contain instructions for safe and effective use of the cleaning product including precautions you should take when applying the product, such as wearing gloves or eye protection and making sure you have good ventilation during use of the product. Remove gloves and wash hands immediately after cleaning.  Monitor yourself for signs and symptoms of illness Caregivers and household members are considered close contacts, should monitor their health, and will be asked to limit movement outside of the home to the extent possible. Follow the monitoring steps for close contacts listed on the symptom monitoring form.   ? If you have additional questions, contact your local health department or call the epidemiologist on call at [phone] (available 24/7). ? This guidance is subject to change. For the most up-to-date guidance from [facility], please refer to their website: [email or web address]

## 2018-06-12 NOTE — ED Triage Notes (Signed)
Pt in c/o headache onset x2 wks, pt seen at [facility] on Friday pt here now for ongoing pain after seeing PCP for facial pain that goes to neck worse on the L side, reports nausea, denies v/d, A&O x4

## 2018-06-13 LAB — NOVEL CORONAVIRUS, NAA (HOSP ORDER, SEND-OUT TO REF LAB; TAT 18-24 HRS): SARS-CoV-2, NAA: NOT DETECTED

## 2018-06-14 ENCOUNTER — Telehealth: Payer: Self-pay

## 2018-06-14 NOTE — Telephone Encounter (Signed)
Sent the 06/01/18 office note to the [facility] adj per her request

## 2018-06-15 DIAGNOSIS — G4489 Other headache syndrome: Secondary | ICD-10-CM | POA: Diagnosis not present

## 2018-06-15 DIAGNOSIS — E236 Other disorders of pituitary gland: Secondary | ICD-10-CM | POA: Diagnosis not present

## 2018-06-19 ENCOUNTER — Encounter: Payer: Self-pay | Admitting: Neurology

## 2018-06-19 ENCOUNTER — Ambulatory Visit (INDEPENDENT_AMBULATORY_CARE_PROVIDER_SITE_OTHER): Payer: BLUE CROSS/BLUE SHIELD | Admitting: Neurology

## 2018-06-19 ENCOUNTER — Other Ambulatory Visit: Payer: Self-pay

## 2018-06-19 DIAGNOSIS — D352 Benign neoplasm of pituitary gland: Secondary | ICD-10-CM | POA: Diagnosis not present

## 2018-06-19 DIAGNOSIS — G43709 Chronic migraine without aura, not intractable, without status migrainosus: Secondary | ICD-10-CM | POA: Diagnosis not present

## 2018-06-19 DIAGNOSIS — IMO0002 Reserved for concepts with insufficient information to code with codable children: Secondary | ICD-10-CM | POA: Insufficient documentation

## 2018-06-19 DIAGNOSIS — G4489 Other headache syndrome: Secondary | ICD-10-CM | POA: Diagnosis not present

## 2018-06-19 DIAGNOSIS — E236 Other disorders of pituitary gland: Secondary | ICD-10-CM | POA: Diagnosis not present

## 2018-06-19 MED ORDER — SUMATRIPTAN SUCCINATE 100 MG PO TABS: 100.0000 mg | ORAL_TABLET | Freq: Once | ORAL | 0 refills | Status: DC

## 2018-06-19 MED ORDER — TOPIRAMATE 100 MG PO TABS: 200.0000 mg | ORAL_TABLET | Freq: Every day | ORAL | 11 refills | Status: DC

## 2018-06-19 MED ORDER — ONDANSETRON 4 MG PO TBDP: 4.0000 mg | ORAL_TABLET | Freq: Three times a day (TID) | ORAL | 6 refills | Status: DC | PRN

## 2018-06-19 NOTE — Progress Notes (Signed)
PATIENT: Laura Greene DOB: [birth year]  Virtual Visit via video  I connected with Laura Greene on 06/19/18 at  by video and verified that I am speaking with the correct person using two identifiers.   I discussed the limitations, risks, security and privacy concerns of performing an evaluation and management service by video and the availability of in person appointments. I also discussed with the patient that there may be a patient responsible charge related to this service. The patient expressed understanding and agreed to proceed.  HISTORICAL  Laura Greene is a 28 year old female, seen in request by her primary care physician Dr. Cari Caraway for evaluation of worsening headaches, abnormal CAT scan of the brain  I have reviewed and summarized the referring note from the referring physician.  She had occasional headache in the past, around her menstruation, bilateral frontal pressure headache was associated light noise sensitivity, nauseous, lasting hours, responding to over-the-counter medication  But since April 2020, she began to have more frequent headaches, more on the left side, also has the character of pounding, extending to her neck, worsening by movement, light noise sensitivity, with nausea, often triggered by missing the meals.  Since May 2020, her headache become persistent, on a daily basis  I personally reviewed CT angiogram of head and neck, no evidence of intracranial abnormality, no large vessel disease, suspected 9 mm pituitary mass,  Laboratory evaluations C-reactive protein was mildly elevated 3.4, normal ESR, BMP, CBC, with hemoglobin of 13.2.    Observations/Objective: I have reviewed problem lists, medications, allergies.  Awake alert oriented to history taking care of conversation  Assessment and Plan: Chronic migraine headaches Pituitary tumor  MRI of the brain with without contrast  Topamax 100 mg 2 tablets every night as migraine prevention  Imitrex 100 mg may add on Zofran 4 mg, Aleve  Also suggested her to see her ophthalmologist  Pituitary functional test is ordered by her primary care doctor  Follow Up Instructions:   In 3 months   I discussed the assessment and treatment plan with the patient. The patient was provided an opportunity to ask questions and all were answered. The patient agreed with the plan and demonstrated an understanding of the instructions.   The patient was advised to call back or seek an in-person evaluation if the symptoms worsen or if the condition fails to improve as anticipated.  I provided 30 minutes of non-face-to-face time during this encounter.  REVIEW OF SYSTEMS: Full 14 system review of systems performed and notable only for as above All other review of systems were negative.  ALLERGIES: Allergies  Allergen Reactions  . Vyvanse [Lisdexamfetamine] Palpitations and Other (See Comments)    At a higher dose, this makes the patient's heart race    HOME MEDICATIONS: Current Outpatient Medications  Medication Sig Dispense Refill  . ibuprofen (ADVIL,MOTRIN) 200 MG tablet Take 800 mg by mouth every 6 (six) hours as needed for headache, moderate pain or cramping.     . ondansetron (ZOFRAN ODT) 4 MG disintegrating tablet Take 1 tablet (4 mg total) by mouth every 8 (eight) hours as needed. 20 tablet 6  . SUMAtriptan (IMITREX) 100 MG tablet Take 1 tablet (100 mg total) by mouth once for 1 dose. May repeat in 2 hours if headache persists or recurs. 1 tablet 0  . topiramate (TOPAMAX) 100 MG tablet Take 2 tablets (200 mg total) by mouth at bedtime. 60 tablet 11   No current facility-administered medications for this visit.  PAST MEDICAL HISTORY: Past Medical History:  Diagnosis Date  . Anxiety   . Asthma   . Depression   . GERD (gastroesophageal reflux disease)     PAST SURGICAL HISTORY: Past Surgical History:  Procedure Laterality Date  . ESOPHAGOGASTRODUODENOSCOPY N/A 04/24/2012    Procedure: ESOPHAGOGASTRODUODENOSCOPY (EGD);  Surgeon: Juanita Craver, MD;  Location: [facility];  Service: Endoscopy;  Laterality: N/A;  . NO PAST SURGERIES      FAMILY HISTORY: Family History  Problem Relation Age of Onset  . Cancer Other   . Rheum arthritis Other     SOCIAL HISTORY:   Social History   Socioeconomic History  . Marital status: Single    Spouse name: Not on file  . Number of children: Not on file  . Years of education: Not on file  . Highest education level: Not on file  Occupational History  . Not on file  Social Needs  . Financial resource strain: Not on file  . Food insecurity:    Worry: Not on file    Inability: Not on file  . Transportation needs:    Medical: Not on file    Non-medical: Not on file  Tobacco Use  . Smoking status: Never Smoker  . Smokeless tobacco: Never Used  Substance and Sexual Activity  . Alcohol use: Yes    Alcohol/week: 0.0 - 1.0 standard drinks    Comment: socially  . Drug use: No  . Sexual activity: Not on file  Lifestyle  . Physical activity:    Days per week: Not on file    Minutes per session: Not on file  . Stress: Not on file  Relationships  . Social connections:    Talks on phone: Not on file    Gets together: Not on file    Attends religious service: Not on file    Active member of club or organization: Not on file    Attends meetings of clubs or organizations: Not on file    Relationship status: Not on file  . Intimate partner violence:    Fear of current or ex partner: Not on file    Emotionally abused: Not on file    Physically abused: Not on file    Forced sexual activity: Not on file  Other Topics Concern  . Not on file  Social History Narrative  . Not on file    Marcial Pacas, M.D. Ph.D.  [facility] [address], [place], [place] [address] Ph: [phone] Fax: [phone]  CC: Cari Caraway, MD

## 2018-06-20 DIAGNOSIS — D443 Neoplasm of uncertain behavior of pituitary gland: Secondary | ICD-10-CM | POA: Diagnosis not present

## 2018-06-20 DIAGNOSIS — R51 Headache: Secondary | ICD-10-CM | POA: Diagnosis not present

## 2018-06-21 ENCOUNTER — Telehealth: Payer: Self-pay | Admitting: Neurology

## 2018-06-21 MED ORDER — SUMATRIPTAN SUCCINATE 100 MG PO TABS: 100.0000 mg | ORAL_TABLET | Freq: Once | ORAL | 11 refills | Status: DC | PRN

## 2018-06-21 NOTE — Telephone Encounter (Signed)
I spoke to the patient to schedule her MRI for 06/26/18 at [facility].  But she had some medication concerns. She took the sumatiptan and it lasted from 9 AM from 9 PM but it was only one tablet she wants to know if there is suppose to have a refill..  Also, topamax was strong and she had some mood swings she wanted to know if she could take one tablet at bed time instead of 2. But she said all the medicine worked and she did not have a headache at all. But she did feel drowsy. She also wanted to know with the topamax if she is suppose to take that daily .

## 2018-06-21 NOTE — Telephone Encounter (Signed)
The patient may need to reduce topiramate to 100mg  at bedtime for a few weeks and let these side effects subside.  Once she is feeling better, then she can attempt to increase the dose again to 200mg  at bedtime.  She is aware that this medication should be taking daily for prevention of migraines.  Additionally, a new prescription will be sent in for her sumatriptan with a greater number of tablets allowed per month.

## 2018-06-21 NOTE — Telephone Encounter (Signed)
Yes, it is ok to back of topamax to 100mg  qhs for a while, if her headaches are under good control, stay one tab every night, if she still have headaches, began take 2 tab qhs.  I resend imitrex Rx.

## 2018-06-21 NOTE — Telephone Encounter (Signed)
no to the covid-19 questions MR Brain w/wo contrast Dr. Willette Pa Auth: [id number] (exp. 06/21/18 to 12/17/18). Patient is scheduled at [facility] for 06/26/18

## 2018-06-21 NOTE — Addendum Note (Signed)
Addended by: Marcial Pacas on: 06/21/2018 02:21 PM   Modules accepted: Orders

## 2018-06-21 NOTE — Telephone Encounter (Addendum)
I called [facility] and spoke to Rowe [phone]).  They are aware the sumatriptan prescription should have been for #12 rather than #1.  The will provide the other tablets to the patient at no additional charge (since she has already paid her co-pay).   The patient is aware and will go the pharmacy to pick up her prescription.    Also, she will attempt the lesser dose of topiramate.  She understands she can stay on the 100 mg at bedtime if the dose is helpful or try to increase back to 200 mg, if needed.  She understands that her topiramate is to be taken daily for prevention and that sumatriptan is used as a rescue prn.

## 2018-06-26 ENCOUNTER — Other Ambulatory Visit: Payer: Self-pay

## 2018-06-26 ENCOUNTER — Telehealth: Payer: Self-pay | Admitting: Neurology

## 2018-06-26 ENCOUNTER — Ambulatory Visit (INDEPENDENT_AMBULATORY_CARE_PROVIDER_SITE_OTHER): Payer: BLUE CROSS/BLUE SHIELD

## 2018-06-26 DIAGNOSIS — G43709 Chronic migraine without aura, not intractable, without status migrainosus: Secondary | ICD-10-CM

## 2018-06-26 DIAGNOSIS — D352 Benign neoplasm of pituitary gland: Secondary | ICD-10-CM | POA: Diagnosis not present

## 2018-06-26 DIAGNOSIS — IMO0002 Reserved for concepts with insufficient information to code with codable children: Secondary | ICD-10-CM

## 2018-06-26 MED ORDER — GADOBENATE DIMEGLUMINE 529 MG/ML IV SOLN
20.0000 mL | Freq: Once | INTRAVENOUS | Status: AC | PRN
  Administered 2018-06-26: 12:00:00 20 mL via INTRAVENOUS

## 2018-06-26 NOTE — Telephone Encounter (Signed)
Patient has requested to switch her sumatriptan medicine. She states that medication is giving her bad side effects.

## 2018-06-26 NOTE — Telephone Encounter (Signed)
Please call, may try other triptans, maxalt, relpax ect

## 2018-06-27 MED ORDER — RIZATRIPTAN BENZOATE 10 MG PO TBDP: 10.0000 mg | ORAL_TABLET | ORAL | 6 refills | Status: DC | PRN

## 2018-06-27 NOTE — Telephone Encounter (Signed)
Patient is taking topamax 100mg  2 every night, which did help her headaches, she has side effect of paresthesia of her fingertips  She has tried Imitrex could not function well after taking the medication did help her headache some  I have called in Maxalt 10 mg dissolvable, she may take it with Aleve and Zofran as needed for severe prolonged headaches

## 2018-06-27 NOTE — Addendum Note (Signed)
Addended by: Marcial Pacas on: 06/27/2018 01:30 PM   Modules accepted: Orders

## 2018-06-27 NOTE — Telephone Encounter (Signed)
I spoke to patient about her migraines. Pt stated her topamax does work for her at night. The only side effect she has is tingling in feet and 3 fingers on her hand. THe imitrex is causing he to be tired. She has took it on May 21, May 22, and May 23, in the morning and it help her headache. After taking the imitrex  it makes her  feels tired, nausea and drain. She has taken the zofran and it helps her nausea. Her employer had to send her home because she cannot function at work. I stated to pt the imitrex is only to be taken at the onset of a headache not daily. Pt wants Dr. Krista Blue to know whats going on and review her medication list.Message will be sent to Dr.Yan.

## 2018-06-28 ENCOUNTER — Telehealth: Payer: Self-pay | Admitting: Neurology

## 2018-06-28 NOTE — Telephone Encounter (Signed)
Spoke to patient and she verbalized understanding of her MRI results.

## 2018-06-28 NOTE — Telephone Encounter (Signed)
Please call patient, MRI of brain showed enlarged pituitary gland, may represent pituitary macroadenoma. 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 - Remainder of brain parenchyma is normal.

## 2018-07-02 ENCOUNTER — Telehealth: Payer: Self-pay | Admitting: Neurology

## 2018-07-02 NOTE — Telephone Encounter (Signed)
I reviewed her ophthalmology evaluation on Jun 20, 2018  Patient complains of pain above eyebrow, when she touches her left eye for 1 month, frequent migraine headaches  Optic nerve, no disc edema, CDR 0.2, retinal vessels are clear with normal caliber

## 2018-08-07 DIAGNOSIS — E236 Other disorders of pituitary gland: Secondary | ICD-10-CM | POA: Diagnosis not present

## 2018-08-07 DIAGNOSIS — R0789 Other chest pain: Secondary | ICD-10-CM | POA: Diagnosis not present

## 2018-08-07 DIAGNOSIS — M62838 Other muscle spasm: Secondary | ICD-10-CM | POA: Diagnosis not present

## 2018-08-07 DIAGNOSIS — G43909 Migraine, unspecified, not intractable, without status migrainosus: Secondary | ICD-10-CM | POA: Diagnosis not present

## 2018-10-02 DIAGNOSIS — L539 Erythematous condition, unspecified: Secondary | ICD-10-CM | POA: Diagnosis not present

## 2019-02-12 ENCOUNTER — Ambulatory Visit: Payer: Self-pay | Attending: Internal Medicine

## 2019-02-13 DIAGNOSIS — Z20828 Contact with and (suspected) exposure to other viral communicable diseases: Secondary | ICD-10-CM | POA: Diagnosis not present

## 2019-02-14 DIAGNOSIS — Z03818 Encounter for observation for suspected exposure to other biological agents ruled out: Secondary | ICD-10-CM | POA: Diagnosis not present

## 2019-02-14 DIAGNOSIS — Z20828 Contact with and (suspected) exposure to other viral communicable diseases: Secondary | ICD-10-CM | POA: Diagnosis not present

## 2019-02-15 DIAGNOSIS — M542 Cervicalgia: Secondary | ICD-10-CM | POA: Diagnosis not present

## 2019-02-15 DIAGNOSIS — H9202 Otalgia, left ear: Secondary | ICD-10-CM | POA: Diagnosis not present

## 2019-04-10 DIAGNOSIS — Z20828 Contact with and (suspected) exposure to other viral communicable diseases: Secondary | ICD-10-CM | POA: Diagnosis not present

## 2019-04-10 DIAGNOSIS — Z03818 Encounter for observation for suspected exposure to other biological agents ruled out: Secondary | ICD-10-CM | POA: Diagnosis not present

## 2019-04-12 DIAGNOSIS — Z20828 Contact with and (suspected) exposure to other viral communicable diseases: Secondary | ICD-10-CM | POA: Diagnosis not present

## 2019-08-14 ENCOUNTER — Other Ambulatory Visit: Payer: Self-pay

## 2019-09-04 DIAGNOSIS — Z20822 Contact with and (suspected) exposure to covid-19: Secondary | ICD-10-CM | POA: Diagnosis not present

## 2019-09-07 DIAGNOSIS — Z20822 Contact with and (suspected) exposure to covid-19: Secondary | ICD-10-CM | POA: Diagnosis not present

## 2019-10-04 DIAGNOSIS — E669 Obesity, unspecified: Secondary | ICD-10-CM | POA: Diagnosis not present

## 2019-10-04 DIAGNOSIS — F411 Generalized anxiety disorder: Secondary | ICD-10-CM | POA: Diagnosis not present

## 2019-10-04 DIAGNOSIS — Z6841 Body Mass Index (BMI) 40.0 and over, adult: Secondary | ICD-10-CM | POA: Diagnosis not present

## 2019-10-04 DIAGNOSIS — F909 Attention-deficit hyperactivity disorder, unspecified type: Secondary | ICD-10-CM | POA: Diagnosis not present

## 2019-12-04 DIAGNOSIS — Z20822 Contact with and (suspected) exposure to covid-19: Secondary | ICD-10-CM | POA: Diagnosis not present

## 2020-12-18 IMAGING — DX DG HIP (WITH OR WITHOUT PELVIS) 2-3V*L*
3 series · 3 of 3 positions shown · non-contrast
Comparison: None.

CLINICAL DATA: Left hip pain

EXAM:
DG HIP (WITH OR WITHOUT PELVIS) 2-3V LEFT

[pelvis ap]
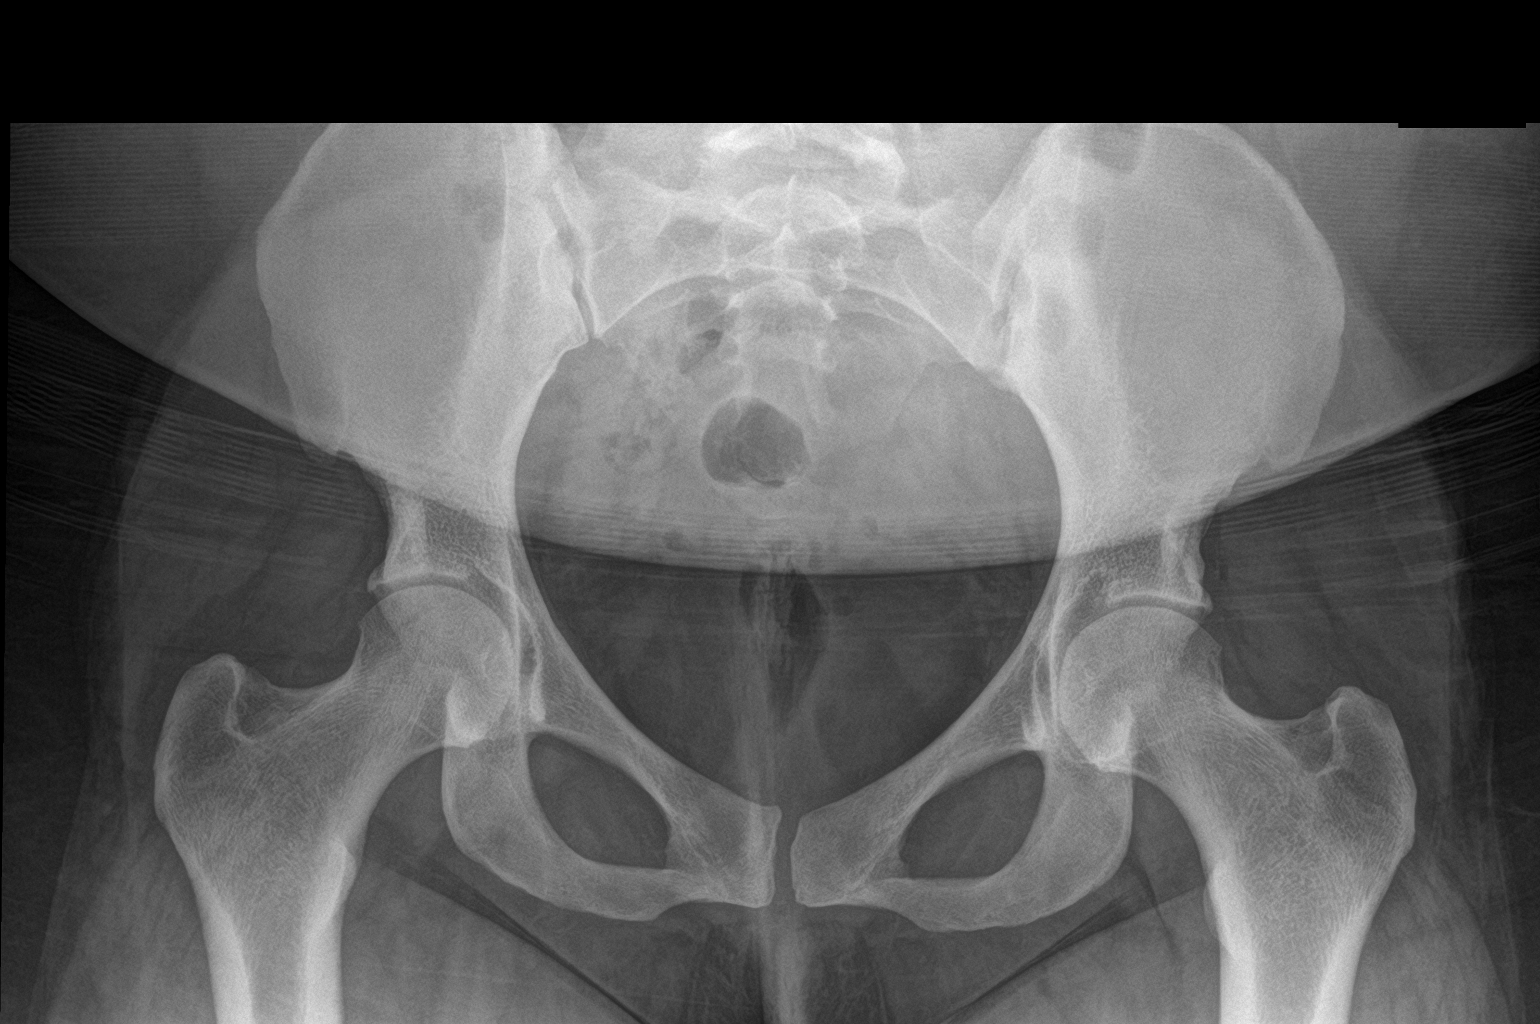

[hip ap]
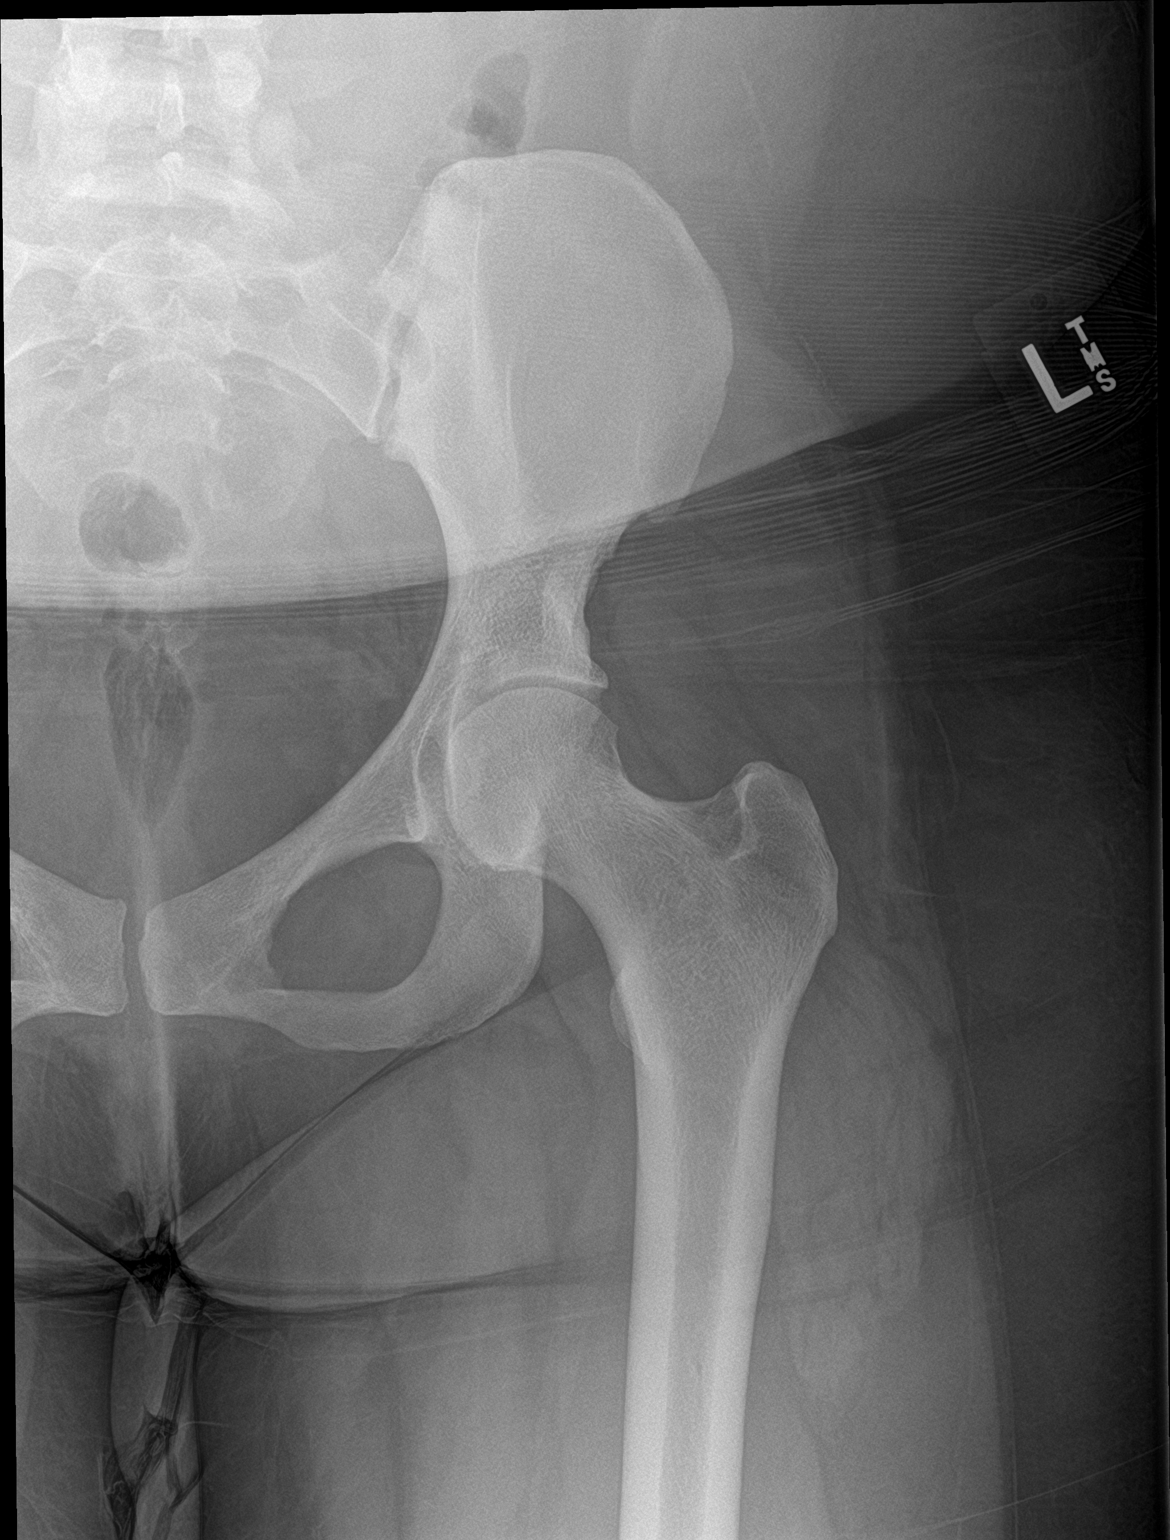

[hip lat]
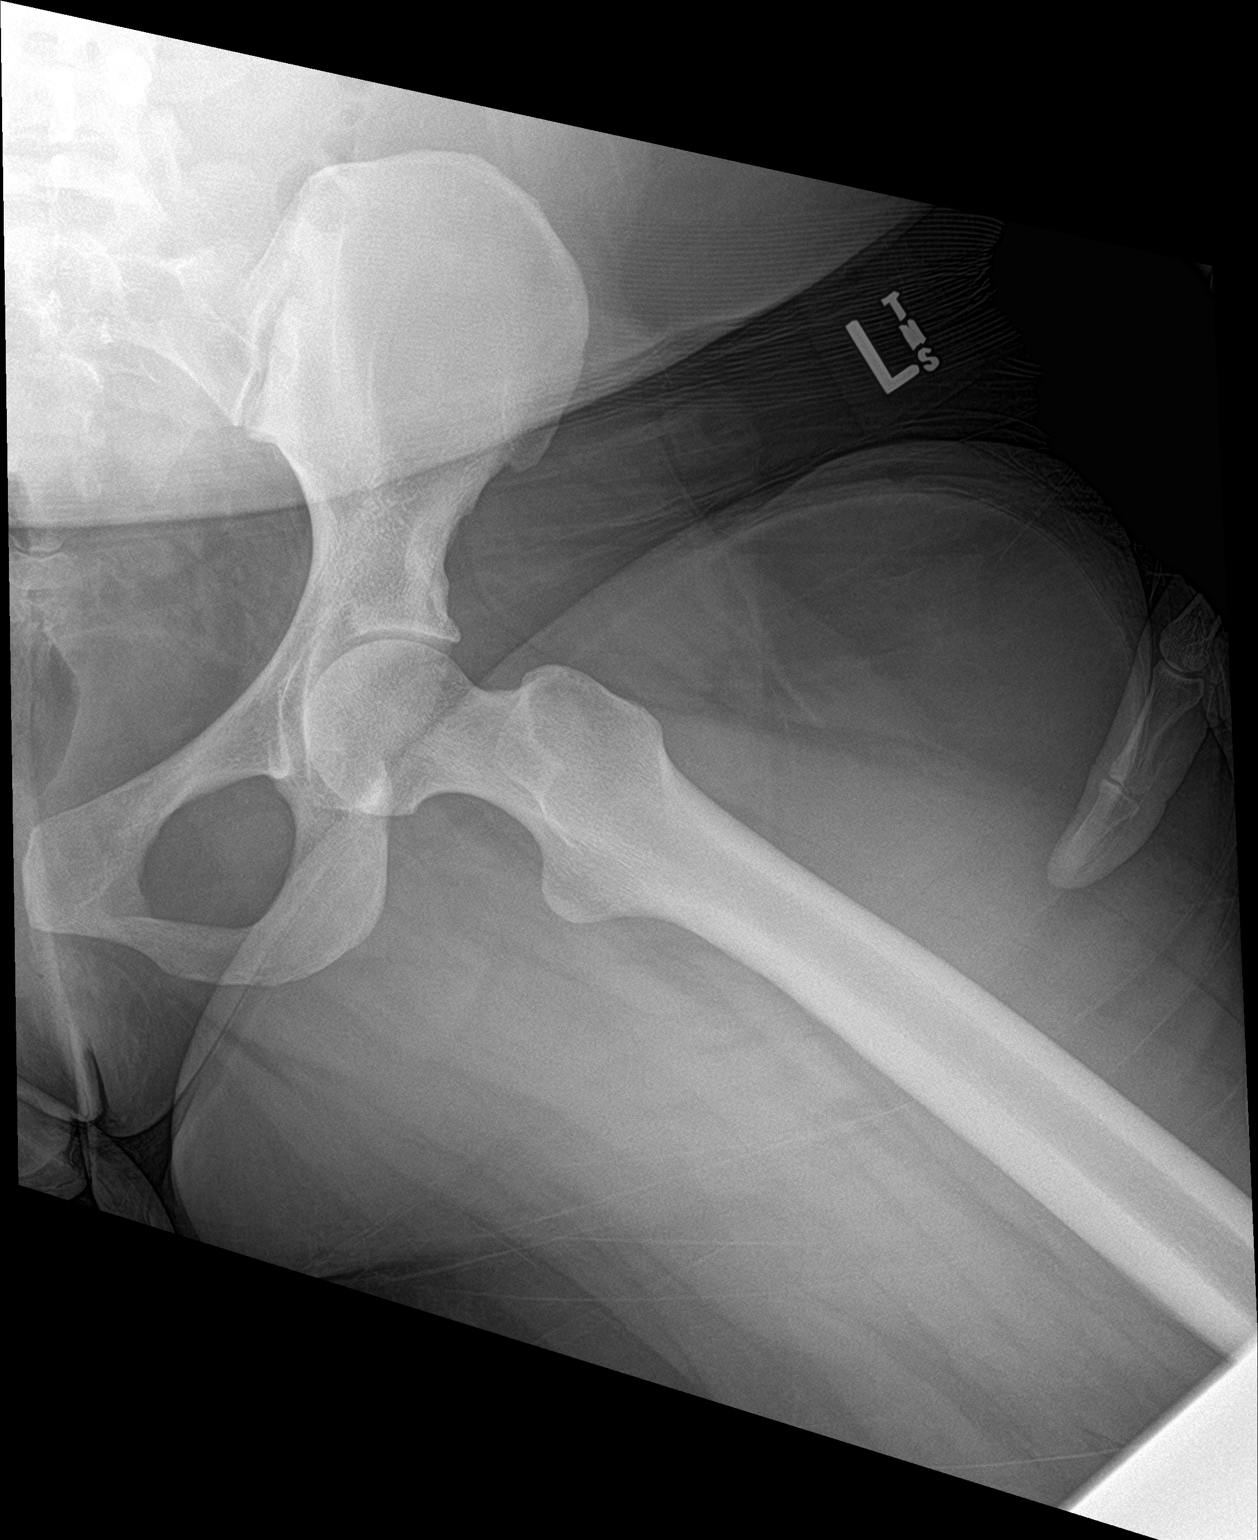

[3 of 3 positions shown; findings below may reference images not displayed]

FINDINGS: There is no evidence of hip fracture or dislocation. There is no
evidence of arthropathy or other focal bone abnormality.
IMPRESSION: Negative.

## 2021-03-11 IMAGING — CT CT ANGIOGRAPHY HEAD
1 of 12 series · 5 of 33 positions shown · IV contrast (APPLIED)
Comparison: None.

CLINICAL DATA: Headache beginning 2 weeks ago. Facial pain
extending into the neck, worse on the left. Nausea.

EXAM:
CT ANGIOGRAPHY HEAD AND NECK
TECHNIQUE: Multidetector CT imaging of the head and neck was performed using
the standard protocol during bolus administration of intravenous
contrast. Multiplanar CT image reconstructions and MIPs were
obtained to evaluate the vascular anatomy. Carotid stenosis
measurements (when applicable) are obtained utilizing NASCET
criteria, using the distal internal carotid diameter as the
denominator.
CONTRAST:  75mL OMNIPAQUE IOHEXOL 350 MG/ML SOLN

[Series 9: ax thins · axial · 0.41mm/px · z∈[-261,-34]mm · 5 of 341 slices shown]
[im 57/341  soft-tissue]
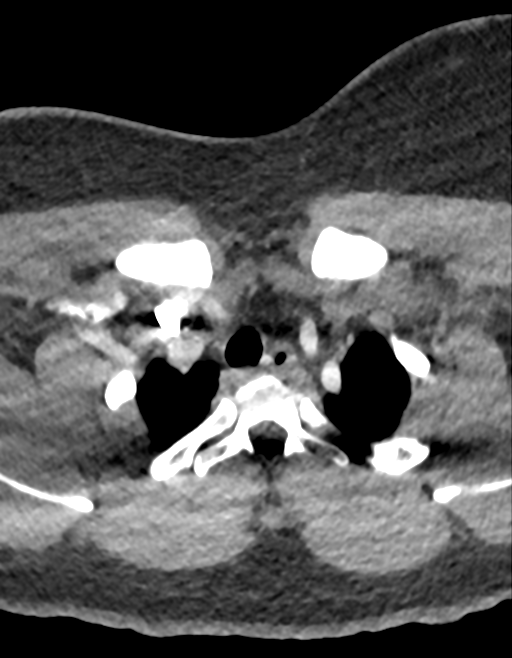
[im 114/341  bone]
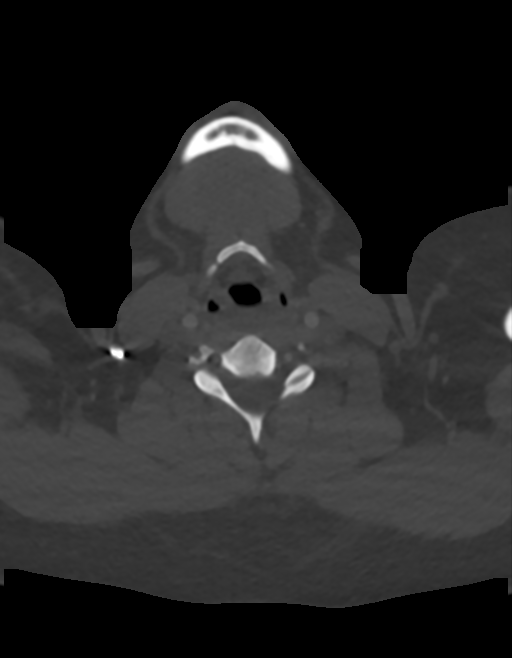
[im 171/341  soft-tissue]
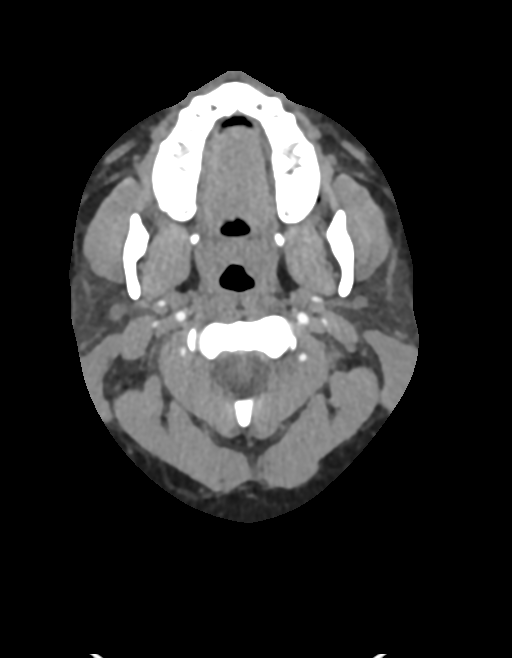
[im 227/341  bone]
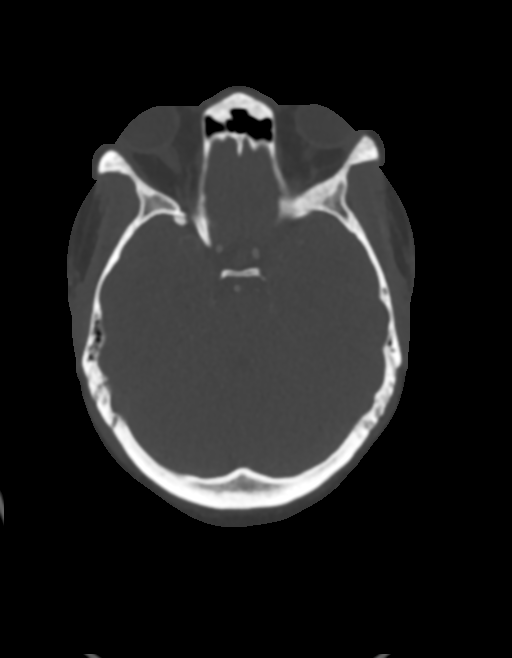
[im 284/341  soft-tissue]
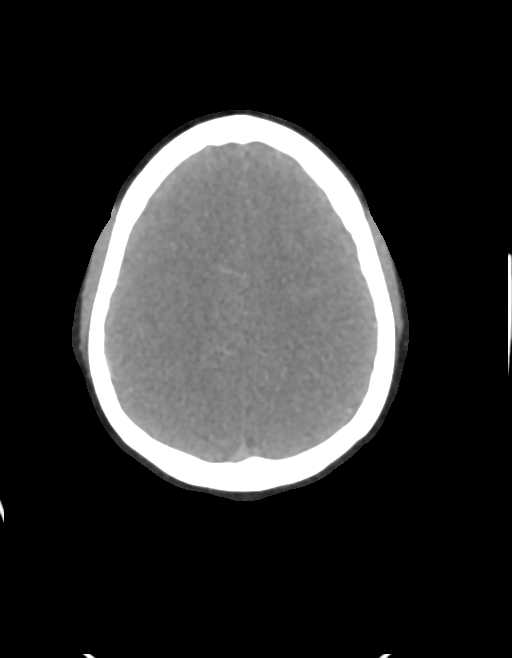

[5 of 33 positions shown; findings below may reference images not displayed]

FINDINGS: CT HEAD FINDINGS

Brain: There is no evidence of acute infarct, intracranial
hemorrhage, midline shift, or extra-axial fluid collection. The
ventricles and sulci are normal. There is the suggestion of a 9 mm
sellar mass.

Vascular: No hyperdense vessel.

Skull: No fracture or focal osseous lesion.

Sinuses: Paranasal sinuses and mastoid air cells are clear.

Orbits: Unremarkable.

Review of the MIP images confirms the above findings

CTA NECK FINDINGS

Aortic arch: Standard 3 vessel aortic arch with widely patent arch
vessel origins.

Right carotid system: Patent and smooth without evidence of stenosis
or dissection.

Left carotid system: Patent and smooth without evidence of stenosis
or dissection.

Vertebral arteries: Patent and smooth without evidence of stenosis
or dissection. Codominant.

Skeleton: No acute osseous abnormality or suspicious osseous lesion.

Other neck: No mass or enlarged lymph nodes identified.

Upper chest: Clear lung apices.

Review of the MIP images confirms the above findings

CTA HEAD FINDINGS

Anterior circulation: The internal carotid arteries are widely
patent from skull base to carotid termini. ACAs and MCAs are patent
without evidence of proximal branch occlusion or significant
proximal stenosis. No aneurysm is identified.

Posterior circulation: The intracranial vertebral arteries are
widely patent to the basilar. Patent AICA and SCA origins are
identified bilaterally. The basilar artery is widely patent.
Posterior communicating arteries are diminutive or absent. PCAs are
patent without evidence of significant proximal stenosis. No
aneurysm is identified.

Venous sinuses: Grossly patent on the delayed postcontrast head CT.

Anatomic variants: None.

Delayed phase: Apparent enhancement of the sellar mass.

Review of the MIP images confirms the above findings
IMPRESSION: 1. No evidence of acute intracranial abnormality.
2. No evidence of major arterial occlusion, significant stenosis, or
aneurysm in the head and neck.
3. Suspected 9 mm pituitary mass. No further imaging evaluation or
follow-up is necessary. Consider endocrine function tests and
correlate for history of pituitary hypersecretion. This follows ACR
consensus guidelines: Management of Incidental Pituitary Findings on
CT, MRI and F18-FDG PET: A White Paper of the ACR Incidental
Findings Committee. [HOSPITAL] 0227; 15: 966-72.

## 2022-01-27 ENCOUNTER — Emergency Department (HOSPITAL_COMMUNITY): Payer: Managed Care, Other (non HMO)

## 2022-01-27 ENCOUNTER — Emergency Department (HOSPITAL_COMMUNITY)
Admission: EM | Admit: 2022-01-27 | Discharge: 2022-01-28 | Disposition: A | Discharge status: Home / Self Care | Payer: Managed Care, Other (non HMO) | Attending: Emergency Medicine | Admitting: Emergency Medicine

## 2022-01-27 ENCOUNTER — Encounter (HOSPITAL_COMMUNITY): Payer: Self-pay | Admitting: Emergency Medicine

## 2022-01-27 DIAGNOSIS — R000 Tachycardia, unspecified: Secondary | ICD-10-CM | POA: Insufficient documentation

## 2022-01-27 DIAGNOSIS — R509 Fever, unspecified: Secondary | ICD-10-CM | POA: Diagnosis present

## 2022-01-27 DIAGNOSIS — J45909 Unspecified asthma, uncomplicated: Secondary | ICD-10-CM | POA: Insufficient documentation

## 2022-01-27 DIAGNOSIS — Z20822 Contact with and (suspected) exposure to covid-19: Secondary | ICD-10-CM | POA: Diagnosis not present

## 2022-01-27 DIAGNOSIS — B349 Viral infection, unspecified: Secondary | ICD-10-CM | POA: Diagnosis not present

## 2022-01-27 LAB — RESP PANEL BY RT-PCR (RSV, FLU A&B, COVID)  RVPGX2
Influenza A by PCR: NEGATIVE
Influenza B by PCR: NEGATIVE
Resp Syncytial Virus by PCR: NEGATIVE
SARS Coronavirus 2 by RT PCR: NEGATIVE

## 2022-01-27 MED ORDER — ACETAMINOPHEN 325 MG PO TABS
650.0000 mg | ORAL_TABLET | Freq: Once | ORAL | Status: AC
  Administered 2022-01-27: 650 mg via ORAL
  Filled 2022-01-27: qty 2

## 2022-01-27 NOTE — ED Triage Notes (Addendum)
Dry hacking cough with fatigue, sore lymph nodes, headache, generalized body aches. Called EMS from [facility] when leaving work. States not allowed to leave work bc of symptoms. VSS.

## 2022-01-27 NOTE — ED Provider Triage Note (Signed)
Emergency Medicine Provider Triage Evaluation Note  Laura Greene , a 31 y.o. female  was evaluated in triage.  Pt complains of fatigue, chills, cough, and sore throatxseveral days.  Review of Systems  Positive: Sore throat, cough Negative: N/V/D  Physical Exam  BP 133/74   Temp (!) 100.4 F (38 C)   Resp 20   LMP 01/13/2022   SpO2 100%  Gen:   Awake, no distress   Resp:  Normal effort  MSK:   Moves extremities without difficulty   Medical Decision Making  Medically screening exam initiated at 8:19 PM.  Appropriate orders placed.  Rilley Poulter was informed that the remainder of the evaluation will be completed by another provider, this initial triage assessment does not replace that evaluation, and the importance of remaining in the ED until their evaluation is complete.    Osvaldo Shipper, [place] 01/27/22 2019

## 2022-01-28 MED ORDER — ACETAMINOPHEN 325 MG PO TABS
650.0000 mg | ORAL_TABLET | Freq: Four times a day (QID) | ORAL | Status: DC | PRN
  Filled 2022-01-28: qty 2

## 2022-01-28 NOTE — ED Provider Notes (Addendum)
[facility] EMERGENCY DEPARTMENT Provider Note   CSN: [id number] Arrival date & time: 01/27/22  2006     History  Chief Complaint  Patient presents with   Fatigue    Laura Greene is a 31 y.o. female with medical history of anxiety, asthma, depression and GERD.  Patient presents to ED for evaluation of generalized bodyaches and chills, sore throat and fatigue.  Patient reports that yesterday she arrived to work and noted that she felt more fatigued than usual.  Patient states that around 1 or 2 PM, she felt very hot and took her temperature and she noted it to be high.  The patient reports that at this time she left work however states that on the way home she was unable to complete her drive back home due to feeling so ill.  Patient reports that she pulled her car over and called EMS at this time who transported her to ED for evaluation.  On exam the patient denies any chest pain, shortness of breath, nausea, vomiting.  Patient does endorse fevers, sore throat, generalized bodyaches and chills.  Patient states "this is exactly how I felt last time I had COVID".  At end of interview, patient also adds that she has tenderness to palpation of the left side of her neck.  Patient reports she feels that it feels swollen.  Patient denies any ear pain.  HPI     Home Medications Prior to Admission medications   Medication Sig Start Date End Date Taking? Authorizing Provider  ibuprofen (ADVIL,MOTRIN) 200 MG tablet Take 800 mg by mouth every 6 (six) hours as needed for headache, moderate pain or cramping.     [provider]  ondansetron (ZOFRAN ODT) 4 MG disintegrating tablet Take 1 tablet (4 mg total) by mouth every 8 (eight) hours as needed. 06/19/18   Marcial Pacas, MD  rizatriptan (MAXALT-MLT) 10 MG disintegrating tablet Take 1 tablet (10 mg total) by mouth as needed. May repeat in 2 hours if needed 06/27/18   Marcial Pacas, MD  topiramate (TOPAMAX) 100 MG tablet Take 2  tablets (200 mg total) by mouth at bedtime. 06/19/18   Marcial Pacas, MD      Allergies    Vyvanse [lisdexamfetamine]    Review of Systems   Review of Systems  Constitutional:  Positive for chills and fever.  HENT:  Positive for sore throat.   Respiratory:  Negative for shortness of breath.   Cardiovascular:  Negative for chest pain.  Gastrointestinal:  Negative for nausea and vomiting.  Musculoskeletal:  Positive for myalgias.  All other systems reviewed and are negative.   Physical Exam Updated Vital Signs BP 131/76 (BP Location: Right Arm)   Pulse (!) 101   Temp 98.5 F (36.9 C) (Oral)   Resp (!) 27   LMP 01/13/2022   SpO2 98%  Physical Exam Vitals and nursing note reviewed.  Constitutional:      General: She is not in acute distress.    Appearance: Normal appearance. She is not ill-appearing, toxic-appearing or diaphoretic.  HENT:     Head: Normocephalic and atraumatic.     Nose: Nose normal. No congestion.     Mouth/Throat:     Mouth: Mucous membranes are moist.     Pharynx: Oropharynx is clear. Posterior oropharyngeal erythema present. No oropharyngeal exudate.     Comments: Posterior oropharynx examined without findings of tonsillar swelling bilaterally, no sign of RPA, PTA.  Patient handling secretions appropriately.  No drooling, no  hot potato voice.  Patient does have erythema to posterior oropharynx however no exudate. Eyes:     Extraocular Movements: Extraocular movements intact.     Conjunctiva/sclera: Conjunctivae normal.     Pupils: Pupils are equal, round, and reactive to light.  Neck:     Comments: Neck palpated, no findings of lymphadenopathy Cardiovascular:     Rate and Rhythm: Regular rhythm. Tachycardia present.  Pulmonary:     Effort: Pulmonary effort is normal.     Breath sounds: Normal breath sounds. No wheezing.  Abdominal:     General: Abdomen is flat. Bowel sounds are normal.     Palpations: Abdomen is soft.     Tenderness: There is no  abdominal tenderness.  Musculoskeletal:     Cervical back: Normal range of motion and neck supple. No tenderness.  Skin:    General: Skin is warm and dry.     Capillary Refill: Capillary refill takes less than 2 seconds.  Neurological:     Mental Status: She is alert and oriented to person, place, and time.     ED Results / Procedures / Treatments   Labs (all labs ordered are listed, but only abnormal results are displayed) Labs Reviewed  RESP PANEL BY RT-PCR (RSV, FLU A&B, COVID)  [id number]    EKG None  Radiology DG Chest 2 View  Result Date: 01/27/2022 CLINICAL DATA:  Fever, cough, fatigue EXAM: CHEST - 2 VIEW COMPARISON:  None Available. FINDINGS: The heart size and mediastinal contours are within normal limits. Both lungs are clear. The visualized skeletal structures are unremarkable. IMPRESSION: No active cardiopulmonary disease. Electronically Signed   By: Randa Ngo M.D.   On: 01/27/2022 21:01    Procedures Procedures   Medications Ordered in ED Medications  acetaminophen (TYLENOL) tablet 650 mg (650 mg Oral Given 01/27/22 2025)    ED Course/ Medical Decision Making/ A&P Clinical Course as of 01/28/22 8469  Fri Jan 28, 2022  0747 SARS Coronavirus 2 by RT PCR: NEGATIVE [CG]    Clinical Course User Index [CG] Azucena Cecil, PA-C                           Medical Decision Making Amount and/or Complexity of Data Reviewed Labs:  Decision-making details documented in ED Course.  Risk OTC drugs.   31 year old female presents to ED for evaluation.  Please see HPI for further details.  On examination the patient is afebrile and slightly tachycardic with a pulse rate of 101.  The patient initially came in with a fever of 100.4 and was provided 650 mg of Tylenol.  On recheck, the patient's temperature is normalized at this time 98.5.  The patient lung sounds are clear bilaterally, she is not hypoxic on room air.  The patient abdomen is soft and compressible  throughout.  Patient neurological examination shows no focal neurodeficits.  The patient posterior oropharynx does have erythema however no exudate.  Patient uvula midline and she is handling secretions appropriately.  No sign of RPA, PTA.  Patient viral testing here negative for all.  Patient chest x-ray shows no consolidations or effusions.  Patient denies recent surgery or travel, exogenous hormone use, history of blood clots, unilateral leg swelling, cough.  In regards to patient neck pain, patient was offered CT scan of soft tissue neck with contrast to evaluate for underlying cause.  I am unable to appreciate any lymphadenopathy on palpation.  Patient deferred this imaging study at  this time stating that she will follow-up with her PCP.  At this time, patient most likely suffering from viral illness.  Patient will be given work note and advised to treat symptoms conservatively at home.  Patient was given return precautions and she voiced understanding.  Patient reports that she does have established relationship with PCP and I have advised her to follow-up with them for recheck and reevaluation.  Patient given return precautions and she voiced understanding.  Patient had all of her questions answered to her satisfaction.  The patient is stable for discharge.  Plan of management discussed with attending Dr. Sherry Ruffing who voices agreement with plan of management.   Final Clinical Impression(s) / ED Diagnoses Final diagnoses:  Viral illness    Rx / DC Orders ED Discharge Orders     None             Azucena Cecil, PA-C 01/28/22 8457    Tegeler, Gwenyth Allegra, MD 01/28/22 1450

## 2022-01-28 NOTE — Discharge Instructions (Addendum)
Return to ED with any new or worsening signs or symptoms Please treat your symptoms conservatively at home.  Please take ibuprofen for body aches and chills.  Please take Tylenol for headaches and fevers.  Please purchase ICAM as this will help shorten the duration of your cold symptoms. Please eat high-protein low-fat diet.  Please push fluids to include body armor, Pedialyte. Please read attached guide concerning viral illnesses in adults Please see attached work note

## 2022-02-04 ENCOUNTER — Emergency Department (HOSPITAL_BASED_OUTPATIENT_CLINIC_OR_DEPARTMENT_OTHER): Payer: Managed Care, Other (non HMO)

## 2022-02-04 ENCOUNTER — Other Ambulatory Visit: Payer: Self-pay

## 2022-02-04 ENCOUNTER — Encounter (HOSPITAL_BASED_OUTPATIENT_CLINIC_OR_DEPARTMENT_OTHER): Payer: Self-pay

## 2022-02-04 ENCOUNTER — Emergency Department (HOSPITAL_BASED_OUTPATIENT_CLINIC_OR_DEPARTMENT_OTHER): Payer: Managed Care, Other (non HMO) | Admitting: Radiology

## 2022-02-04 ENCOUNTER — Emergency Department (HOSPITAL_BASED_OUTPATIENT_CLINIC_OR_DEPARTMENT_OTHER)
Admission: EM | Admit: 2022-02-04 | Discharge: 2022-02-04 | Disposition: A | Discharge status: Home / Self Care | Payer: Managed Care, Other (non HMO) | Source: Home / Self Care | Attending: Emergency Medicine | Admitting: Emergency Medicine

## 2022-02-04 DIAGNOSIS — R112 Nausea with vomiting, unspecified: Secondary | ICD-10-CM | POA: Insufficient documentation

## 2022-02-04 DIAGNOSIS — K859 Acute pancreatitis without necrosis or infection, unspecified: Secondary | ICD-10-CM

## 2022-02-04 LAB — CBC WITH DIFFERENTIAL/PLATELET
Abs Immature Granulocytes: 0.02 10*3/uL (ref 0.00–0.07)
Basophils Absolute: 0 10*3/uL (ref 0.0–0.1)
Basophils Relative: 1 %
Eosinophils Absolute: 0 10*3/uL (ref 0.0–0.5)
Eosinophils Relative: 1 %
HCT: 37.3 % (ref 36.0–46.0)
Hemoglobin: 11.6 g/dL — ABNORMAL LOW (ref 12.0–15.0)
Immature Granulocytes: 0 %
Lymphocytes Relative: 27 %
Lymphs Abs: 1.7 10*3/uL (ref 0.7–4.0)
MCH: 22.4 pg — ABNORMAL LOW (ref 26.0–34.0)
MCHC: 31.1 g/dL (ref 30.0–36.0)
MCV: 71.9 fL — ABNORMAL LOW (ref 80.0–100.0)
Monocytes Absolute: 0.3 10*3/uL (ref 0.1–1.0)
Monocytes Relative: 5 %
Neutro Abs: 4.1 10*3/uL (ref 1.7–7.7)
Neutrophils Relative %: 66 %
Platelets: 370 10*3/uL (ref 150–400)
RBC: 5.19 MIL/uL — ABNORMAL HIGH (ref 3.87–5.11)
RDW: 18.6 % — ABNORMAL HIGH (ref 11.5–15.5)
WBC: 6.2 10*3/uL (ref 4.0–10.5)
nRBC: 0 % (ref 0.0–0.2)

## 2022-02-04 LAB — BASIC METABOLIC PANEL
Anion gap: 11 (ref 5–15)
BUN: 11 mg/dL (ref 6–20)
CO2: 26 mmol/L (ref 22–32)
Calcium: 9.7 mg/dL (ref 8.9–10.3)
Chloride: 103 mmol/L (ref 98–111)
Creatinine, Ser: 1.01 mg/dL — ABNORMAL HIGH (ref 0.44–1.00)
GFR, Estimated: >60 mL/min (ref >60)
Glucose, Bld: 95 mg/dL (ref 70–99)
Potassium: 4 mmol/L (ref 3.5–5.1)
Sodium: 140 mmol/L (ref 135–145)

## 2022-02-04 LAB — TROPONIN I (HIGH SENSITIVITY)
Troponin I (High Sensitivity): <2 ng/L (ref <18)
Troponin I (High Sensitivity): <2 ng/L (ref <18)

## 2022-02-04 LAB — HEPATIC FUNCTION PANEL
ALT: 10 U/L (ref 0–44)
AST: 20 U/L (ref 15–41)
Albumin: 4.2 g/dL (ref 3.5–5.0)
Alkaline Phosphatase: 48 U/L (ref 38–126)
Bilirubin, Direct: 0.1 mg/dL (ref 0.0–0.2)
Indirect Bilirubin: 0.4 mg/dL (ref 0.3–0.9)
Total Bilirubin: 0.5 mg/dL (ref 0.3–1.2)
Total Protein: 8.1 g/dL (ref 6.5–8.1)

## 2022-02-04 LAB — HCG, SERUM, QUALITATIVE: Preg, Serum: NEGATIVE

## 2022-02-04 LAB — D-DIMER, QUANTITATIVE: D-Dimer, Quant: 0.34 ug/mL-FEU (ref 0.00–0.50)

## 2022-02-04 LAB — LIPASE, BLOOD: Lipase: 42 U/L (ref 11–51)

## 2022-02-04 MED ORDER — ONDANSETRON 4 MG PO TBDP: 4.0000 mg | ORAL_TABLET | Freq: Three times a day (TID) | ORAL | 0 refills | Status: AC | PRN

## 2022-02-04 MED ORDER — IOHEXOL 300 MG/ML  SOLN
100.0000 mL | Freq: Once | INTRAMUSCULAR | Status: AC | PRN
  Administered 2022-02-04: 100 mL via INTRAVENOUS

## 2022-02-04 MED ORDER — KETOROLAC TROMETHAMINE 30 MG/ML IJ SOLN
30.0000 mg | Freq: Once | INTRAMUSCULAR | Status: AC
  Administered 2022-02-04: 30 mg via INTRAVENOUS
  Filled 2022-02-04: qty 1

## 2022-02-04 MED ORDER — SODIUM CHLORIDE 0.9 % IV BOLUS
1000.0000 mL | Freq: Once | INTRAVENOUS | Status: AC
  Administered 2022-02-04: 1000 mL via INTRAVENOUS

## 2022-02-04 MED ORDER — HYDROCODONE-ACETAMINOPHEN 5-325 MG PO TABS: 1.0000 | ORAL_TABLET | Freq: Four times a day (QID) | ORAL | 0 refills | Status: DC | PRN

## 2022-02-04 MED ORDER — HYDROCODONE-ACETAMINOPHEN 5-325 MG PO TABS
2.0000 | ORAL_TABLET | Freq: Once | ORAL | Status: AC
  Administered 2022-02-04: 2 via ORAL
  Filled 2022-02-04: qty 2

## 2022-02-04 NOTE — ED Notes (Signed)
Patient transported to X-ray 

## 2022-02-04 NOTE — ED Notes (Signed)
Patient verbalizes understanding of discharge instructions. Opportunity for questioning and answers were provided. Patient discharged from ED.  °

## 2022-02-04 NOTE — ED Triage Notes (Signed)
Reports left upper back pain that radiates around under breast.  Reports went to [facility] and was prescribed muscle relaxers but it didn't help but reports now having pain with drinking water and vomiting. Reports 3-4x vomiting this morning.  Area is painful to touch.  Denies urinary symptoms. LBM today

## 2022-02-04 NOTE — ED Provider Notes (Signed)
[facility] EMERGENCY DEPT Provider Note   CSN: [id number] Arrival date & time: 02/04/22  1141     History  Chief Complaint  Patient presents with   Back Pain    Laura Greene is a 32 y.o. female presents to the ED with complaint of left upper back pain that radiates around to her left chest wall and under her left breast.  She reports that she went to a doctor a couple of days ago and was prescribed muscle relaxers, but that did not help.  Patient reports she is now having pain with drinking water and has been vomiting.  She reports that she has vomited 3-4 times this morning after lying on her left side.  Patient states that the area is extremely painful to touch.  She was recently diagnosed with viral illness on 01/27/2022.  Denies fever, chills, diarrhea, shortness of breath, syncope, weakness, cough, dizziness.          Home Medications Prior to Admission medications   Medication Sig Start Date End Date Taking? Authorizing Provider  HYDROcodone-acetaminophen (NORCO/VICODIN) 5-325 MG tablet Take 1-2 tablets by mouth every 6 (six) hours as needed. 02/04/22  Yes Aundray Cartlidge R, PA  ondansetron (ZOFRAN-ODT) 4 MG disintegrating tablet Take 1 tablet (4 mg total) by mouth every 8 (eight) hours as needed for nausea or vomiting. 02/04/22  Yes Sanjna Haskew R, PA  ibuprofen (ADVIL,MOTRIN) 200 MG tablet Take 800 mg by mouth every 6 (six) hours as needed for headache, moderate pain or cramping.     [provider]  rizatriptan (MAXALT-MLT) 10 MG disintegrating tablet Take 1 tablet (10 mg total) by mouth as needed. May repeat in 2 hours if needed 06/27/18   Marcial Pacas, MD  topiramate (TOPAMAX) 100 MG tablet Take 2 tablets (200 mg total) by mouth at bedtime. 06/19/18   Marcial Pacas, MD      Allergies    Vyvanse [lisdexamfetamine]    Review of Systems   Review of Systems  Constitutional:  Negative for chills and fever.  Respiratory:  Negative for cough, chest tightness and  shortness of breath.   Cardiovascular:  Positive for chest pain.  Gastrointestinal:  Positive for constipation, nausea and vomiting. Negative for abdominal pain and diarrhea.  Musculoskeletal:  Positive for back pain.  Neurological:  Negative for dizziness, syncope, weakness and light-headedness.    Physical Exam Updated Vital Signs BP 91/64   Pulse 72   Temp 98 F (36.7 C) (Oral)   Resp 15   Ht '5\' 5"'$  (1.651 m)   Wt 108.9 kg   LMP 01/13/2022   SpO2 98%   BMI 39.94 kg/m  Physical Exam Vitals and nursing note reviewed. Exam conducted with a chaperone present.  Constitutional:      General: She is not in acute distress.    Appearance: She is not ill-appearing or toxic-appearing.  HENT:     Mouth/Throat:     Mouth: Mucous membranes are moist.     Pharynx: Oropharynx is clear.  Cardiovascular:     Rate and Rhythm: Normal rate and regular rhythm.     Pulses: Normal pulses.     Heart sounds: Normal heart sounds.  Pulmonary:     Effort: Pulmonary effort is normal. No tachypnea or respiratory distress.     Breath sounds: Normal breath sounds and air entry.  Chest:     Chest wall: Tenderness present. No deformity or swelling.       Comments: Tenderness to the left chest  wall under the left breast.  No evidence of rash. Abdominal:     General: Abdomen is flat. Bowel sounds are normal. There is no distension.     Palpations: Abdomen is soft.     Tenderness: There is abdominal tenderness in the left upper quadrant.  Musculoskeletal:     Right lower leg: No edema.     Left lower leg: No edema.  Skin:    General: Skin is warm and dry.     Capillary Refill: Capillary refill takes less than 2 seconds.  Neurological:     Mental Status: She is alert. Mental status is at baseline.  Psychiatric:        Mood and Affect: Mood normal.        Behavior: Behavior normal.     ED Results / Procedures / Treatments   Labs (all labs ordered are listed, but only abnormal results are  displayed) Labs Reviewed  CBC WITH DIFFERENTIAL/PLATELET - Abnormal; Notable for the following components:      Result Value   RBC 5.19 (*)    Hemoglobin 11.6 (*)    MCV 71.9 (*)    MCH 22.4 (*)    RDW 18.6 (*)    All other components within normal limits  BASIC METABOLIC PANEL - Abnormal; Notable for the following components:   Creatinine, Ser 1.01 (*)    All other components within normal limits  D-DIMER, QUANTITATIVE  LIPASE, BLOOD  HEPATIC FUNCTION PANEL  HCG, SERUM, QUALITATIVE  TROPONIN I (HIGH SENSITIVITY)  TROPONIN I (HIGH SENSITIVITY)    EKG EKG Interpretation  Date/Time:  Friday February 04 2022 12:35:29 EST Ventricular Rate:  83 PR Interval:  180 QRS Duration: 86 QT Interval:  363 QTC Calculation: 427 R Axis:   47 Text Interpretation: Sinus rhythm Baseline wander in lead(s) V1 No significant change was found Confirmed by Ezequiel Essex [phone]) on 02/04/2022 12:55:05 PM  Radiology CT ABDOMEN PELVIS W CONTRAST  Result Date: 02/04/2022 CLINICAL DATA:  Abdominal pain.  Acute.  Vomiting. EXAM: CT ABDOMEN AND PELVIS WITH CONTRAST TECHNIQUE: Multidetector CT imaging of the abdomen and pelvis was performed using the standard protocol following bolus administration of intravenous contrast. RADIATION DOSE REDUCTION: This exam was performed according to the departmental dose-optimization program which includes automated exposure control, adjustment of the mA and/or kV according to patient size and/or use of iterative reconstruction technique. CONTRAST:  141m OMNIPAQUE IOHEXOL 300 MG/ML  SOLN COMPARISON:  04/18/2012 FINDINGS: Lower chest: No pleural fluid or airspace disease. Hepatobiliary: No focal liver abnormality is seen. No gallstones, gallbladder wall thickening, or biliary dilatation. Common bile duct is upper limits of normal measuring 6 mm. Pancreas: There is mild diffuse pancreatic edema and peripancreatic soft tissue stranding. No signs of main duct dilatation. No mass or  abnormal fluid collections. No evidence for pancreatic necrosis. Spleen: Normal in size without focal abnormality. Adrenals/Urinary Tract: Normal adrenal glands. The kidneys are unremarkable. No nephrolithiasis, hydronephrosis, or mass. Urinary bladder is unremarkable. Stomach/Bowel: Stomach appears normal. The appendix is visualized and appears mildly thickened measuring 8 mm. The wall of the appendix is mildly thickened measuring 3 mm, image 66/2. No periappendiceal fat stranding or free fluid. No bowel wall thickening, inflammation or distension. Vascular/Lymphatic: No significant vascular findings are present. No enlarged abdominal or pelvic lymph nodes. Reproductive: Multiple uterine fibroids are identified. The largest is in the right side of uterus measuring 4.8 x 3.6 cm, image 71/2. No adnexal mass. Other: No significant free fluid or fluid collections.  Musculoskeletal: No acute or significant osseous findings. IMPRESSION: 1. Mild diffuse pancreatic edema and peripancreatic soft tissue stranding compatible with acute pancreatitis. No evidence for pancreatic necrosis or pseudocyst formation 2. The appendix is visualized and appears mildly thickened measuring 8 mm. The wall of the appendix is mildly thickened measuring 3 mm. No periappendiceal fat stranding or free fluid. Cannot exclude early acute appendicitis. Clinical correlation advised. 3. Multiple uterine fibroids. Electronically Signed   By: Kerby Moors M.D.   On: 02/04/2022 17:32   DG Chest 2 View  Result Date: 02/04/2022 CLINICAL DATA:  Chest wall pain EXAM: CHEST - 2 VIEW COMPARISON:  Chest x-ray January 26, 2022 FINDINGS: The heart size and mediastinal contours are within normal limits. Low lung volumes with bronchovascular crowding. No focal pulmonary opacity. No pleural effusion or pneumothorax. The visualized upper abdomen is unremarkable. No acute osseous abnormality. IMPRESSION: No acute cardiopulmonary abnormality. Electronically Signed    By: Beryle Flock M.D.   On: 02/04/2022 12:35    Procedures Procedures    Medications Ordered in ED Medications  HYDROcodone-acetaminophen (NORCO/VICODIN) 5-325 MG per tablet 2 tablet (2 tablets Oral Given 02/04/22 1335)  sodium chloride 0.9 % bolus 1,000 mL (1,000 mLs Intravenous New Bag/Given 02/04/22 1404)  ketorolac (TORADOL) 30 MG/ML injection 30 mg (30 mg Intravenous Given 02/04/22 1704)  iohexol (OMNIPAQUE) 300 MG/ML solution 100 mL (100 mLs Intravenous Contrast Given 02/04/22 1712)    ED Course/ Medical Decision Making/ A&P                           Medical Decision Making Amount and/or Complexity of Data Reviewed Labs: ordered. Radiology: ordered. ECG/medicine tests: ordered.  Risk Prescription drug management.   This patient presents to the ED with chief complaint(s) of left side chest and back pain with associated nausea and vomitting with pertinent past medical history of pancreatitis, GERD, pituitary ademona .The complaint involves an extensive differential diagnosis and also carries with it a high risk of complications and morbidity.    The differential diagnosis includes costochondritis, Tietze's syndrome, ACS, PE, pancreatitis, pericarditis, pneumothorax, muscle strain   The initial plan is to obtain baseline labs including troponin and chest x-ray  Additional history obtained: Additional history obtained from  none Records reviewed Primary Care Documents  Initial Assessment:   Patient appears to be very uncomfortable, but in no acute distress.  Exam significant for tenderness to palpation of left chest wall, more specifically under the left breast.  Tenderness to palpation extends down to LUQ of abdomen and to her back on the left side.  Lungs are clear to auscultation bilaterally.  She is mildly tachypneic, which may be related to her severe pain.  Heart rate is normal with regular rhythm.  Skin is warm and dry.    Independent ECG/labs interpretation:  The  following labs were independently interpreted:  ECG demonstrates sinus rhythm without evidence of ectopy, ischemia, or infarction.  No global ST elevation to suggest pericarditis. CBC significant for microcytic anemia, hemoglobin 11.6.  No evidence of leukocytosis. D-dimer is negative. Troponin is negative. Metabolic panel reveals no major electrolyte disturbance, creatinine mildly elevated at 1.01.  Normal anion gap. Lipase and hepatic function panel are within normal limits.  Independent visualization and interpretation of imaging: I independently visualized the following imaging with scope of interpretation limited to determining acute life threatening conditions related to emergency care: Chest x-ray, which revealed no evidence of infiltrates to suggest pneumonia, no pleural  effusion, no pneumothorax.  Heart is of normal size.  I agree with radiologist interpretation.  Treatment and Reassessment: Patient's pain was treated initially with Norco, patient reports it did improve her pain however she was still having a dull ache in her chest.  Will provide patient with fluids given the borderline elevated creatinine and patient reporting vomiting.  Patient was given IV Toradol for pain.   Discussed lab results with patient, who appeared to to be frustrated due to still having significant left-sided chest wall and upper quadrant pain.  Patient states she does not believe it is musculoskeletal and that she has not had a cough in several days, but the pain has continued to worsen.  Patient concerned that pain medicine did not relieve her symptoms.  She also expresses concerns for vomiting and constipation.  Given patient's additional concerns will order CT abdomen pelvis with contrast.  CT abdomen pelvis revealed evidence suggestive of acute pancreatitis, despite normal lipase.  She reports she does have epigastric cramping whenever consuming liquids.  She has been tolerating liquids while in the ED and  has not had any episodes of emesis.  She also reports feeling better following Toradol.  Disposition:   The patient has been appropriately medically screened and/or stabilized in the ED. I have low suspicion for any other emergent medical condition which would require further screening, evaluation or treatment in the ED or require inpatient management. At time of discharge the patient is hemodynamically stable and in no acute distress. I have discussed work-up results and diagnosis with patient and answered all questions. Patient is agreeable with discharge plan. We discussed strict return precautions for returning to the emergency department and they verbalized understanding.  Discussed with patient home treatment for suspected acute pancreatitis including following a strict liquid diet, use of pain medication for severe pain, use of acetaminophen or ibuprofen for mild to moderate pain, and following up with her PCP next week.  Discussed with patient if her symptoms are not well-managed at home despite medication, to return to ED, advised to go to [facility] or [facility] for possible hospital admission.  Patient verbally agrees with this plan and strict return precautions.  Discussed HPI, physical exam findings, assessment and plan with attending Ezequiel Essex.  The attending physician assessed patient independently as part of a shared visit.          Final Clinical Impression(s) / ED Diagnoses Final diagnoses:  Acute pancreatitis without infection or necrosis, unspecified pancreatitis type    Rx / DC Orders ED Discharge Orders          Ordered    HYDROcodone-acetaminophen (NORCO/VICODIN) 5-325 MG tablet  Every 6 hours PRN        02/04/22 1802    ondansetron (ZOFRAN-ODT) 4 MG disintegrating tablet  Every 8 hours PRN        02/04/22 1802              Pat Kocher, PA 02/04/22 1809    Ezequiel Essex, MD 02/05/22 [phone]

## 2022-02-04 NOTE — Discharge Instructions (Addendum)
Thank you for allowing me to be part of your care today.  Your workup was suggestive of acute pancreatitis which was found on your CT scan.  I have sent 2 prescriptions to your pharmacy including a medication for pain and a medication for nausea/vomiting.  I have also attached information regarding a liquid diet.  It is very important you follow a strict liquid diet for the first couple of days giving your pancreas time to rest and get better.  You may escalate your diet as tolerated.  If you continue to have pain and are unable to tolerate fluids, I advise you to go to [facility] or [facility], ED for possible hospital admission for management.  I recommend using Tylenol or ibuprofen for mild to moderate pain.  I recommend you follow-up with your PCP sometime next week.  I have also provided referral information for gastroenterology for follow-up.

## 2022-02-05 ENCOUNTER — Other Ambulatory Visit: Payer: Self-pay

## 2022-02-05 ENCOUNTER — Encounter (HOSPITAL_COMMUNITY): Payer: Self-pay | Admitting: Emergency Medicine

## 2022-02-05 ENCOUNTER — Inpatient Hospital Stay (HOSPITAL_COMMUNITY)
Admission: EM | Admit: 2022-02-05 | Discharge: 2022-02-10 | DRG: 439 | Disposition: A | Discharge status: Home / Self Care | Payer: Managed Care, Other (non HMO) | Attending: Family Medicine | Admitting: Family Medicine

## 2022-02-05 DIAGNOSIS — B9711 Coxsackievirus as the cause of diseases classified elsewhere: Secondary | ICD-10-CM | POA: Diagnosis present

## 2022-02-05 DIAGNOSIS — Z79899 Other long term (current) drug therapy: Secondary | ICD-10-CM

## 2022-02-05 DIAGNOSIS — F419 Anxiety disorder, unspecified: Secondary | ICD-10-CM | POA: Diagnosis present

## 2022-02-05 DIAGNOSIS — J45909 Unspecified asthma, uncomplicated: Secondary | ICD-10-CM | POA: Diagnosis present

## 2022-02-05 DIAGNOSIS — N920 Excessive and frequent menstruation with regular cycle: Secondary | ICD-10-CM | POA: Diagnosis present

## 2022-02-05 DIAGNOSIS — Z6841 Body Mass Index (BMI) 40.0 and over, adult: Secondary | ICD-10-CM

## 2022-02-05 DIAGNOSIS — R203 Hyperesthesia: Secondary | ICD-10-CM | POA: Diagnosis present

## 2022-02-05 DIAGNOSIS — I809 Phlebitis and thrombophlebitis of unspecified site: Secondary | ICD-10-CM | POA: Diagnosis present

## 2022-02-05 DIAGNOSIS — K859 Acute pancreatitis without necrosis or infection, unspecified: Principal | ICD-10-CM | POA: Diagnosis present

## 2022-02-05 DIAGNOSIS — K219 Gastro-esophageal reflux disease without esophagitis: Secondary | ICD-10-CM | POA: Diagnosis present

## 2022-02-05 DIAGNOSIS — F32A Depression, unspecified: Secondary | ICD-10-CM | POA: Diagnosis present

## 2022-02-05 DIAGNOSIS — K8689 Other specified diseases of pancreas: Secondary | ICD-10-CM | POA: Diagnosis present

## 2022-02-05 DIAGNOSIS — Z888 Allergy status to other drugs, medicaments and biological substances status: Secondary | ICD-10-CM

## 2022-02-05 DIAGNOSIS — D509 Iron deficiency anemia, unspecified: Secondary | ICD-10-CM | POA: Diagnosis present

## 2022-02-05 LAB — COMPREHENSIVE METABOLIC PANEL
ALT: 12 U/L (ref 0–44)
AST: 13 U/L — ABNORMAL LOW (ref 15–41)
Albumin: 3.7 g/dL (ref 3.5–5.0)
Alkaline Phosphatase: 50 U/L (ref 38–126)
Anion gap: 11 (ref 5–15)
BUN: 10 mg/dL (ref 6–20)
CO2: 21 mmol/L — ABNORMAL LOW (ref 22–32)
Calcium: 9 mg/dL (ref 8.9–10.3)
Chloride: 104 mmol/L (ref 98–111)
Creatinine, Ser: 1.15 mg/dL — ABNORMAL HIGH (ref 0.44–1.00)
GFR, Estimated: >60 mL/min (ref >60)
Glucose, Bld: 102 mg/dL — ABNORMAL HIGH (ref 70–99)
Potassium: 3.9 mmol/L (ref 3.5–5.1)
Sodium: 136 mmol/L (ref 135–145)
Total Bilirubin: 0.4 mg/dL (ref 0.3–1.2)
Total Protein: 7.9 g/dL (ref 6.5–8.1)

## 2022-02-05 LAB — CBC
HCT: 36.6 % (ref 36.0–46.0)
Hemoglobin: 11.2 g/dL — ABNORMAL LOW (ref 12.0–15.0)
MCH: 22.1 pg — ABNORMAL LOW (ref 26.0–34.0)
MCHC: 30.6 g/dL (ref 30.0–36.0)
MCV: 72.2 fL — ABNORMAL LOW (ref 80.0–100.0)
Platelets: 349 10*3/uL (ref 150–400)
RBC: 5.07 MIL/uL (ref 3.87–5.11)
RDW: 18 % — ABNORMAL HIGH (ref 11.5–15.5)
WBC: 9.5 10*3/uL (ref 4.0–10.5)
nRBC: 0 % (ref 0.0–0.2)

## 2022-02-05 LAB — URINALYSIS, ROUTINE W REFLEX MICROSCOPIC
Bilirubin Urine: NEGATIVE
Glucose, UA: NEGATIVE mg/dL
Ketones, ur: 20 mg/dL — AB
Nitrite: NEGATIVE
Protein, ur: 100 mg/dL — AB
RBC / HPF: >50 RBC/hpf — ABNORMAL HIGH (ref 0–5)
Specific Gravity, Urine: 1.025 (ref 1.005–1.030)
pH: 5 (ref 5.0–8.0)

## 2022-02-05 LAB — LIPASE, BLOOD: Lipase: 59 U/L — ABNORMAL HIGH (ref 11–51)

## 2022-02-05 LAB — HCG, QUANTITATIVE, PREGNANCY: hCG, Beta Chain, Quant, S: <1 m[IU]/mL (ref <5)

## 2022-02-05 LAB — TRIGLYCERIDES: Triglycerides: 50 mg/dL (ref <150)

## 2022-02-05 LAB — LACTIC ACID, PLASMA: Lactic Acid, Venous: 1.3 mmol/L (ref 0.5–1.9)

## 2022-02-05 MED ORDER — ONDANSETRON HCL 4 MG/2ML IJ SOLN
4.0000 mg | Freq: Once | INTRAMUSCULAR | Status: AC
  Administered 2022-02-05: 4 mg via INTRAVENOUS
  Filled 2022-02-05: qty 2

## 2022-02-05 MED ORDER — KETOROLAC TROMETHAMINE 15 MG/ML IJ SOLN
15.0000 mg | Freq: Once | INTRAMUSCULAR | Status: AC
  Administered 2022-02-05: 15 mg via INTRAVENOUS
  Filled 2022-02-05: qty 1

## 2022-02-05 MED ORDER — HYDROMORPHONE HCL 1 MG/ML IJ SOLN
1.0000 mg | Freq: Once | INTRAMUSCULAR | Status: DC
  Filled 2022-02-05: qty 1

## 2022-02-05 MED ORDER — SODIUM CHLORIDE 0.9 % IV BOLUS
1000.0000 mL | Freq: Once | INTRAVENOUS | Status: AC
  Administered 2022-02-05: 1000 mL via INTRAVENOUS

## 2022-02-05 NOTE — ED Notes (Addendum)
Aware of need for urine sample, unable to provide sample in triage. Urine specimen cup provided.

## 2022-02-05 NOTE — ED Provider Notes (Signed)
[place] DEPT Provider Note   CSN: [id number] Arrival date & time: 02/05/22  2122     History  No chief complaint on file.   Laura Greene is a 32 y.o. female who presents emergency department with concerns for left upper quadrant pain onset 01/27/2022.  Patient denies history of excessive alcohol use.  Still has her gallbladder at this time.  Denies recent injury, trauma, fall. Patient notes that she has had this left upper quadrant pain that has been evaluated multiple times most recently yesterday at [facility] emergency department.  At that time she was told that she had pancreatitis.  She was instructed to return to the emergency department if her symptoms worsen for admission to the hospital.  Patient has tried her prescription Zofran and Norco without relief of her symptoms.  Notes that she still had episode of emesis at home.  Denies chest pain, shortness of breath, urinary symptoms, fever.    The history is provided by the patient. No language interpreter was used.       Home Medications Prior to Admission medications   Medication Sig Start Date End Date Taking? Authorizing Provider  amphetamine-dextroamphetamine (ADDERALL XR) 20 MG 24 hr capsule Take 20 mg by mouth daily.   Yes [provider]  baclofen (LIORESAL) 10 MG tablet Take 10 mg by mouth daily as needed. 02/02/22  Yes [provider]  escitalopram (LEXAPRO) 20 MG tablet Take 20 mg by mouth at bedtime.   Yes [provider]  fluticasone (FLONASE) 50 MCG/ACT nasal spray Place 1 spray into both nostrils daily as needed for allergies. 12/17/21  Yes [provider]  HYDROcodone-acetaminophen (NORCO/VICODIN) 5-325 MG tablet Take 1-2 tablets by mouth every 6 (six) hours as needed. 02/04/22   Clark, Meghan R, PA  ondansetron (ZOFRAN-ODT) 4 MG disintegrating tablet Take 1 tablet (4 mg total) by mouth every 8 (eight) hours as needed for nausea or vomiting. 02/04/22    Clark, Meghan R, PA  rizatriptan (MAXALT-MLT) 10 MG disintegrating tablet Take 1 tablet (10 mg total) by mouth as needed. May repeat in 2 hours if needed Patient not taking: Reported on 02/06/2022 06/27/18   Marcial Pacas, MD  topiramate (TOPAMAX) 100 MG tablet Take 2 tablets (200 mg total) by mouth at bedtime. Patient not taking: Reported on 02/06/2022 06/19/18   Marcial Pacas, MD      Allergies    Vyvanse [lisdexamfetamine]    Review of Systems   Review of Systems  All other systems reviewed and are negative.   Physical Exam Updated Vital Signs BP (!) 146/89   Pulse 92   Temp 98.2 F (36.8 C) (Oral)   Resp 18   Ht '5\' 5"'$  (1.651 m)   Wt 108 kg   LMP 01/13/2022   SpO2 99%   BMI 39.62 kg/m  Physical Exam Vitals and nursing note reviewed.  Constitutional:      General: She is not in acute distress.    Appearance: She is not diaphoretic.  HENT:     Head: Normocephalic and atraumatic.     Mouth/Throat:     Pharynx: No oropharyngeal exudate.  Eyes:     General: No scleral icterus.    Conjunctiva/sclera: Conjunctivae normal.  Cardiovascular:     Rate and Rhythm: Normal rate and regular rhythm.     Pulses: Normal pulses.     Heart sounds: Normal heart sounds.  Pulmonary:     Effort: Pulmonary effort is normal. No respiratory distress.  Breath sounds: Normal breath sounds. No wheezing.  Abdominal:     General: Bowel sounds are normal.     Palpations: Abdomen is soft. There is no mass.     Tenderness: There is abdominal tenderness in the left upper quadrant and left lower quadrant. There is no guarding or rebound.     Comments: Left-sided abdominal tenderness to palpation, LUQ>LLQ.  No overlying skin changes.  No rash noted.  No rebound rigidity or guarding noted.  Musculoskeletal:        General: Normal range of motion.     Cervical back: Normal range of motion and neck supple.  Skin:    General: Skin is warm and dry.     Comments: No appreciable rash noted.   Neurological:      Mental Status: She is alert.  Psychiatric:        Behavior: Behavior normal.     ED Results / Procedures / Treatments   Labs (all labs ordered are listed, but only abnormal results are displayed) Labs Reviewed  LIPASE, BLOOD - Abnormal; Notable for the following components:      Result Value   Lipase 59 (*)    All other components within normal limits  COMPREHENSIVE METABOLIC PANEL - Abnormal; Notable for the following components:   CO2 21 (*)    Glucose, Bld 102 (*)    Creatinine, Ser 1.15 (*)    AST 13 (*)    All other components within normal limits  CBC - Abnormal; Notable for the following components:   Hemoglobin 11.2 (*)    MCV 72.2 (*)    MCH 22.1 (*)    RDW 18.0 (*)    All other components within normal limits  URINALYSIS, ROUTINE W REFLEX MICROSCOPIC - Abnormal; Notable for the following components:   APPearance HAZY (*)    Hgb urine dipstick LARGE (*)    Ketones, ur 20 (*)    Protein, ur 100 (*)    Leukocytes,Ua TRACE (*)    RBC / HPF >50 (*)    Bacteria, UA RARE (*)    All other components within normal limits  LACTIC ACID, PLASMA  TRIGLYCERIDES  HCG, QUANTITATIVE, PREGNANCY    EKG None  Radiology CT ABDOMEN PELVIS W CONTRAST  Result Date: 02/04/2022 CLINICAL DATA:  Abdominal pain.  Acute.  Vomiting. EXAM: CT ABDOMEN AND PELVIS WITH CONTRAST TECHNIQUE: Multidetector CT imaging of the abdomen and pelvis was performed using the standard protocol following bolus administration of intravenous contrast. RADIATION DOSE REDUCTION: This exam was performed according to the departmental dose-optimization program which includes automated exposure control, adjustment of the mA and/or kV according to patient size and/or use of iterative reconstruction technique. CONTRAST:  151m OMNIPAQUE IOHEXOL 300 MG/ML  SOLN COMPARISON:  04/18/2012 FINDINGS: Lower chest: No pleural fluid or airspace disease. Hepatobiliary: No focal liver abnormality is seen. No gallstones,  gallbladder wall thickening, or biliary dilatation. Common bile duct is upper limits of normal measuring 6 mm. Pancreas: There is mild diffuse pancreatic edema and peripancreatic soft tissue stranding. No signs of main duct dilatation. No mass or abnormal fluid collections. No evidence for pancreatic necrosis. Spleen: Normal in size without focal abnormality. Adrenals/Urinary Tract: Normal adrenal glands. The kidneys are unremarkable. No nephrolithiasis, hydronephrosis, or mass. Urinary bladder is unremarkable. Stomach/Bowel: Stomach appears normal. The appendix is visualized and appears mildly thickened measuring 8 mm. The wall of the appendix is mildly thickened measuring 3 mm, image 66/2. No periappendiceal fat stranding or free fluid.  No bowel wall thickening, inflammation or distension. Vascular/Lymphatic: No significant vascular findings are present. No enlarged abdominal or pelvic lymph nodes. Reproductive: Multiple uterine fibroids are identified. The largest is in the right side of uterus measuring 4.8 x 3.6 cm, image 71/2. No adnexal mass. Other: No significant free fluid or fluid collections. Musculoskeletal: No acute or significant osseous findings. IMPRESSION: 1. Mild diffuse pancreatic edema and peripancreatic soft tissue stranding compatible with acute pancreatitis. No evidence for pancreatic necrosis or pseudocyst formation 2. The appendix is visualized and appears mildly thickened measuring 8 mm. The wall of the appendix is mildly thickened measuring 3 mm. No periappendiceal fat stranding or free fluid. Cannot exclude early acute appendicitis. Clinical correlation advised. 3. Multiple uterine fibroids. Electronically Signed   By: Kerby Moors M.D.   On: 02/04/2022 17:32   DG Chest 2 View  Result Date: 02/04/2022 CLINICAL DATA:  Chest wall pain EXAM: CHEST - 2 VIEW COMPARISON:  Chest x-ray January 26, 2022 FINDINGS: The heart size and mediastinal contours are within normal limits. Low lung  volumes with bronchovascular crowding. No focal pulmonary opacity. No pleural effusion or pneumothorax. The visualized upper abdomen is unremarkable. No acute osseous abnormality. IMPRESSION: No acute cardiopulmonary abnormality. Electronically Signed   By: Beryle Flock M.D.   On: 02/04/2022 12:35    Procedures Procedures    Medications Ordered in ED Medications  HYDROmorphone (DILAUDID) injection 1 mg (has no administration in time range)  sodium chloride 0.9 % bolus 1,000 mL (1,000 mLs Intravenous New Bag/Given 02/05/22 2352)  ondansetron (ZOFRAN) injection 4 mg (4 mg Intravenous Given 02/05/22 2350)  ketorolac (TORADOL) 15 MG/ML injection 15 mg (15 mg Intravenous Given 02/05/22 2350)    ED Course/ Medical Decision Making/ A&P Clinical Course as of 02/06/22 0046  Sat Feb 05, 2022  2259 This is a 81-year female presenting to the ED with continued abdominal pain after diagnosis of acute uncomplicated pancreatitis in the ER yesterday per CT imaging.  She reports that she has not type of viral flulike illness at the end of December, into the start of January, and that her abdominal pain began about 2 to 3 days afterwards.  She has not had any alcohol since mid December and did not have any recent binge drinks.  She reports a distant history of pancreatitis many years ago but cannot recall the detail surrounding this.  She was seen in ED yesterday had a CT scan which showed some mild peripancreatic inflammatory stranding, and some mild wall thickening around the appendix as well.  She was sent home on oral pain medications but returns today complaining of worsening pain, unable to keep down food or fluids, vomiting at home, feeling dehydrated.  On exam her pain is specifically located in the left upper quadrant radiating towards her back, with epigastric tenderness.  She has no tenderness in the right lower quadrant or at McBurney's point.  Her white blood cell count is normal and stable from yesterday.   Her lipase does have a very minor elevation, LFTs otherwise unremarkable.  I suspect she has pancreatitis secondary to a viral infection.  We will admit her for pain control and for fluids and nausea control for pancreatitis.  I have a lower suspicion for acute appendicitis at this time per her clinical exam.  However if she were to develop worsening pain in the right lower quadrant, or leukocytosis, or other concerning signs of appendicitis in the hospital, general surgery could be consulted on the inpatient service. [  MT]  Sun Feb 06, 2022  0030 Pt re-evaluated and noted that pain slightly improved. Pt would like additional pain meds at this time. Discussed with patient plans for admission. Pt agreeable.  [SB]  0031 Consult with hospitalist, Dr. Alcario Drought who will evaluate for admission. [SB]    Clinical Course User Index [MT] Trifan, Carola Rhine, MD [SB] Elena Cothern A, PA-C                           Medical Decision Making Amount and/or Complexity of Data Reviewed Labs: ordered.  Risk Prescription drug management. Decision regarding hospitalization.   Patient presents to the emergency department with LUQ pain since 01/27/22.  Has been evaluated for her symptoms twice.  Still has her gallbladder and appendix.  Denies recent excessive alcohol use.  Patient previously had URI-like symptoms prior to the onset of her abdominal pain.  Patient afebrile, not tachycardic or hypoxic.  On exam patient with left-sided abdominal tenderness to palpation, LUQ>LLQ.  No appreciable rash noted. Remainder of exam without acute findings.  Differential diagnosis includes pancreatitis, diverticulitis, shingles, nephrolithiasis, pyelonephritis.  Additional history obtained:  External records from outside source obtained and reviewed including: patient was evaluated in the [facility] emergency department on 02/04/2022.  At that time had complete workup that was negative for cardiac etiology, no concerns for shingles. Pt  was informed   Labs:  I ordered, and personally interpreted labs.  The pertinent results include:  Urinalysis notable for large amount of hemoglobin (patient is currently on menstrual cycle). Triglycerides normal Negative lactic acid Negative hCG CMP with elevated creatinine at 1.15 otherwise unremarkable. Lipase elevated at 59 otherwise unremarkable CBC without leukocytosis   Medications:  I ordered medication including IV fluids, Zofran, Toradol, Dilaudid for symptom management.  Reevaluation of the patient after these medicines and interventions, I reevaluated the patient and found that they have improved I have reviewed the patients home medicines and have made adjustments as needed   Consultations: I requested consultation with the Hospitalist, and discussed lab and imaging findings as well as pertinent plan - they recommend: will evaluated for admission.   Disposition: Presenting suspicious for acute pancreatitis, likely in the setting of recent viral URI.  Doubt at this time concerns for diverticulitis, shingles, nephrolithiasis, pyelonephritis.  Patient without tenderness to palpation to right lower quadrant.  Attending evaluated patient and agrees with plans for admission. After consideration of the diagnostic results and the patients response to treatment, I feel that the patient would benefit from Admission to the hospital.  Discussed with patient and significant other regarding plans for admission.  Patient agreeable at this time.  Patient appears safe for admission.    This chart was dictated using voice recognition software, Dragon. Despite the best efforts of this provider to proofread and correct errors, errors may still occur which can change documentation meaning.  Final Clinical Impression(s) / ED Diagnoses Final diagnoses:  Acute pancreatitis without infection or necrosis, unspecified pancreatitis type    Rx / DC Orders ED Discharge Orders     None          Kanden Carey A, PA-C 02/06/22 0047    Wyvonnia Dusky, MD 02/06/22 1401

## 2022-02-05 NOTE — ED Triage Notes (Addendum)
Pt was treated in the ER yesterday for pancreatitis. Now not tolerating PO intake and pain has worsened to L flank and upper abd. No improvement with prescribed hydrocodone now, initially felt much better after being discharged from ER.  She was instructed by [facility] nurse line to return to ER.  Was admitted previous for 10 days for similar GI complaints, was not officially diagnosed with pancreatitis at that time. This was in 2013.   Not a daily drinker. No new medications.  Denies fever, chills.

## 2022-02-05 NOTE — ED Provider Triage Note (Signed)
Emergency Medicine Provider Triage Evaluation Note  Laura Greene , a 32 y.o. female  was evaluated in triage.  Pt complains of abdominal pain.  Patient reports that she was seen in the emergency department yesterday and diagnosed with acute pancreatitis.  She reports that she had some symptomatic relief prior to discharge yesterday but symptoms have worsened today with pain more severe.  She reports most the pain is in her right upper quadrant as well as left upper quadrant and left side of abdomen.  She reports some episodes of nausea and vomiting due to severe pain.  Denies any fevers, headaches, diarrhea.  Review of Systems  Positive: As above Negative: As above  Physical Exam  BP (!) 157/96 (BP Location: Right Arm)   Pulse 84   Temp 98.2 F (36.8 C) (Oral)   Resp 18   Ht '5\' 5"'$  (1.651 m)   Wt 108 kg   LMP 01/13/2022   SpO2 96%   BMI 39.62 kg/m  Gen:   Awake, unable to find a comfortable sitting position Resp:  Normal effort clear to auscultation bilaterally MSK:   Moves extremities without difficulty  Other:  Right upper quadrant, left upper quadrant, left-sided abdominal tenderness on palpation.  Medical Decision Making  Medically screening exam initiated at 9:47 PM.  Appropriate orders placed.  Laura Greene was informed that the remainder of the evaluation will be completed by another provider, this initial triage assessment does not replace that evaluation, and the importance of remaining in the ED until their evaluation is complete.     Luvenia Heller, PA-C 02/05/22 2148

## 2022-02-06 ENCOUNTER — Emergency Department (HOSPITAL_COMMUNITY): Payer: Managed Care, Other (non HMO)

## 2022-02-06 DIAGNOSIS — B9711 Coxsackievirus as the cause of diseases classified elsewhere: Secondary | ICD-10-CM | POA: Diagnosis present

## 2022-02-06 DIAGNOSIS — Z6841 Body Mass Index (BMI) 40.0 and over, adult: Secondary | ICD-10-CM | POA: Diagnosis not present

## 2022-02-06 DIAGNOSIS — J45909 Unspecified asthma, uncomplicated: Secondary | ICD-10-CM | POA: Diagnosis present

## 2022-02-06 DIAGNOSIS — I809 Phlebitis and thrombophlebitis of unspecified site: Secondary | ICD-10-CM | POA: Diagnosis present

## 2022-02-06 DIAGNOSIS — F419 Anxiety disorder, unspecified: Secondary | ICD-10-CM | POA: Diagnosis present

## 2022-02-06 DIAGNOSIS — K219 Gastro-esophageal reflux disease without esophagitis: Secondary | ICD-10-CM | POA: Diagnosis present

## 2022-02-06 DIAGNOSIS — D509 Iron deficiency anemia, unspecified: Secondary | ICD-10-CM | POA: Diagnosis present

## 2022-02-06 DIAGNOSIS — Z888 Allergy status to other drugs, medicaments and biological substances status: Secondary | ICD-10-CM | POA: Diagnosis not present

## 2022-02-06 DIAGNOSIS — N920 Excessive and frequent menstruation with regular cycle: Secondary | ICD-10-CM | POA: Diagnosis present

## 2022-02-06 DIAGNOSIS — Z79899 Other long term (current) drug therapy: Secondary | ICD-10-CM | POA: Diagnosis not present

## 2022-02-06 DIAGNOSIS — F32A Depression, unspecified: Secondary | ICD-10-CM | POA: Diagnosis present

## 2022-02-06 DIAGNOSIS — K8689 Other specified diseases of pancreas: Secondary | ICD-10-CM | POA: Diagnosis present

## 2022-02-06 DIAGNOSIS — K859 Acute pancreatitis without necrosis or infection, unspecified: Secondary | ICD-10-CM | POA: Diagnosis present

## 2022-02-06 DIAGNOSIS — E66813 Obesity, class 3: Secondary | ICD-10-CM | POA: Diagnosis present

## 2022-02-06 DIAGNOSIS — R203 Hyperesthesia: Secondary | ICD-10-CM | POA: Diagnosis present

## 2022-02-06 LAB — COMPREHENSIVE METABOLIC PANEL
ALT: 11 U/L (ref 0–44)
AST: 9 U/L — ABNORMAL LOW (ref 15–41)
Albumin: 3.1 g/dL — ABNORMAL LOW (ref 3.5–5.0)
Alkaline Phosphatase: 43 U/L (ref 38–126)
Anion gap: 7 (ref 5–15)
BUN: 10 mg/dL (ref 6–20)
CO2: 22 mmol/L (ref 22–32)
Calcium: 8.3 mg/dL — ABNORMAL LOW (ref 8.9–10.3)
Chloride: 107 mmol/L (ref 98–111)
Creatinine, Ser: 0.97 mg/dL (ref 0.44–1.00)
GFR, Estimated: >60 mL/min (ref >60)
Glucose, Bld: 96 mg/dL (ref 70–99)
Potassium: 3.8 mmol/L (ref 3.5–5.1)
Sodium: 136 mmol/L (ref 135–145)
Total Bilirubin: 0.5 mg/dL (ref 0.3–1.2)
Total Protein: 6.6 g/dL (ref 6.5–8.1)

## 2022-02-06 LAB — RESPIRATORY PANEL BY PCR

## 2022-02-06 LAB — CBC
HCT: 31.6 % — ABNORMAL LOW (ref 36.0–46.0)
Hemoglobin: 9.7 g/dL — ABNORMAL LOW (ref 12.0–15.0)
MCH: 22.2 pg — ABNORMAL LOW (ref 26.0–34.0)
MCHC: 30.7 g/dL (ref 30.0–36.0)
MCV: 72.3 fL — ABNORMAL LOW (ref 80.0–100.0)
Platelets: 296 10*3/uL (ref 150–400)
RBC: 4.37 MIL/uL (ref 3.87–5.11)
RDW: 17.6 % — ABNORMAL HIGH (ref 11.5–15.5)
WBC: 7.1 10*3/uL (ref 4.0–10.5)
nRBC: 0 % (ref 0.0–0.2)

## 2022-02-06 LAB — HIV ANTIBODY (ROUTINE TESTING W REFLEX): HIV Screen 4th Generation wRfx: NONREACTIVE

## 2022-02-06 MED ORDER — LACTATED RINGERS IV SOLN: INTRAVENOUS | Status: DC

## 2022-02-06 MED ORDER — ONDANSETRON HCL 4 MG PO TABS: 4.0000 mg | ORAL_TABLET | Freq: Four times a day (QID) | ORAL | Status: DC | PRN

## 2022-02-06 MED ORDER — HYDROMORPHONE HCL 1 MG/ML IJ SOLN
1.0000 mg | Freq: Once | INTRAMUSCULAR | Status: AC
  Administered 2022-02-06: 1 mg via INTRAVENOUS
  Filled 2022-02-06: qty 1

## 2022-02-06 MED ORDER — HYDROMORPHONE HCL 1 MG/ML IJ SOLN
0.5000 mg | INTRAMUSCULAR | Status: DC | PRN
  Administered 2022-02-06 (×4): 1 mg via INTRAVENOUS
  Filled 2022-02-06 (×4): qty 1

## 2022-02-06 MED ORDER — ACETAMINOPHEN 325 MG PO TABS
650.0000 mg | ORAL_TABLET | Freq: Four times a day (QID) | ORAL | Status: DC | PRN
  Administered 2022-02-06: 650 mg via ORAL
  Filled 2022-02-06: qty 2

## 2022-02-06 MED ORDER — ESCITALOPRAM OXALATE 20 MG PO TABS
20.0000 mg | ORAL_TABLET | Freq: Every day | ORAL | Status: DC
  Filled 2022-02-06: qty 1

## 2022-02-06 MED ORDER — ONDANSETRON HCL 4 MG/2ML IJ SOLN
4.0000 mg | Freq: Four times a day (QID) | INTRAMUSCULAR | Status: DC | PRN
  Administered 2022-02-06 – 2022-02-08 (×5): 4 mg via INTRAVENOUS
  Filled 2022-02-06 (×6): qty 2

## 2022-02-06 MED ORDER — FLUTICASONE PROPIONATE 50 MCG/ACT NA SUSP: 1.0000 | Freq: Every day | NASAL | Status: DC | PRN

## 2022-02-06 MED ORDER — ACETAMINOPHEN 650 MG RE SUPP: 650.0000 mg | Freq: Four times a day (QID) | RECTAL | Status: DC | PRN

## 2022-02-06 MED ORDER — HYDROMORPHONE HCL 1 MG/ML IJ SOLN
0.5000 mg | INTRAMUSCULAR | Status: DC | PRN
  Administered 2022-02-06 – 2022-02-09 (×16): 1 mg via INTRAVENOUS
  Administered 2022-02-09: 0.5 mg via INTRAVENOUS
  Filled 2022-02-06 (×17): qty 1

## 2022-02-06 MED ORDER — ENOXAPARIN SODIUM 40 MG/0.4ML IJ SOSY
40.0000 mg | PREFILLED_SYRINGE | INTRAMUSCULAR | Status: DC
  Filled 2022-02-06: qty 0.4

## 2022-02-06 MED ORDER — NALOXONE HCL 0.4 MG/ML IJ SOLN: 0.4000 mg | INTRAMUSCULAR | Status: DC | PRN

## 2022-02-06 MED ORDER — KETOROLAC TROMETHAMINE 15 MG/ML IJ SOLN
7.5000 mg | Freq: Three times a day (TID) | INTRAMUSCULAR | Status: AC | PRN
  Administered 2022-02-06 (×2): 7.5 mg via INTRAVENOUS
  Filled 2022-02-06 (×2): qty 1

## 2022-02-06 NOTE — H&P (Addendum)
History and Physical    Patient: Rana Hochstein ATF:[id number] DOB: [birth year] DOA: 02/05/2022 DOS: the patient was seen and examined on 02/06/2022 PCP: Cari Caraway, MD  Patient coming from: Home  Chief Complaint: Abd pain HPI: Haivyn Oravec is a 32 y.o. female with medical history significant of asthma, obesity, GERD.  Pt presents to ED with c/o abd pain in epigastric area.  Pt initially ill with URI symptoms starting 01/27/22.  Cough, congestion, runny nose.  Started to feel better from those symptoms, tried to eat a steak.  Developed GI symptoms around the 1st.  GI symptoms including N/V and epigastric and L sided abdominal pain have been persistent since onset.  Seen in ED yesterday, found to have acute pancreatitis, told to return if symptoms worsened.  Returned to ED today with persistent symptoms.  Notes that she still had episode of emesis at home. Denies chest pain, shortness of breath, urinary symptoms, fever.     Review of Systems: As mentioned in the history of present illness. All other systems reviewed and are negative. Past Medical History:  Diagnosis Date   Anxiety    Asthma    Depression    GERD (gastroesophageal reflux disease)    Past Surgical History:  Procedure Laterality Date   ESOPHAGOGASTRODUODENOSCOPY N/A 04/24/2012   Procedure: ESOPHAGOGASTRODUODENOSCOPY (EGD);  Surgeon: Juanita Craver, MD;  Location: [facility];  Service: Endoscopy;  Laterality: N/A;   NO PAST SURGERIES     Social History:  reports that she has never smoked. She has never used smokeless tobacco. She reports current alcohol use. She reports that she does not use drugs.  Allergies  Allergen Reactions   Vyvanse [Lisdexamfetamine] Palpitations and Other (See Comments)    At a higher dose, this makes the patient's heart race    Family History  Problem Relation Age of Onset   Cancer Other    Rheum arthritis Other     Prior to Admission medications   Medication Sig Start Date  End Date Taking? Authorizing Provider  amphetamine-dextroamphetamine (ADDERALL XR) 20 MG 24 hr capsule Take 20 mg by mouth daily.   Yes [provider]  baclofen (LIORESAL) 10 MG tablet Take 10 mg by mouth daily as needed. 02/02/22  Yes [provider]  escitalopram (LEXAPRO) 20 MG tablet Take 20 mg by mouth at bedtime.   Yes [provider]  fluticasone (FLONASE) 50 MCG/ACT nasal spray Place 1 spray into both nostrils daily as needed for allergies. 12/17/21  Yes [provider]  HYDROcodone-acetaminophen (NORCO/VICODIN) 5-325 MG tablet Take 1-2 tablets by mouth every 6 (six) hours as needed. 02/04/22   Clark, Meghan R, PA  ondansetron (ZOFRAN-ODT) 4 MG disintegrating tablet Take 1 tablet (4 mg total) by mouth every 8 (eight) hours as needed for nausea or vomiting. 02/04/22   Theressa Stamps R, PA    Physical Exam: Vitals:   02/06/22 0050 02/06/22 0115 02/06/22 0152 02/06/22 0155  BP: 135/75 136/81  103/86  Pulse: 85 80  74  Resp: '17 16  18  '$ Temp:  98 F (36.7 C)    TempSrc:  Oral    SpO2: 99% 94%  98%  Weight:   123.8 kg   Height:   '5\' 5"'$  (1.651 m)    Constitutional: NAD, calm, comfortable Eyes: PERRL, lids and conjunctivae normal ENMT: Mucous membranes are moist. Posterior pharynx clear of any exudate or lesions.Normal dentition.  Neck: normal, supple, no masses, no thyromegaly Respiratory: clear to auscultation bilaterally, no wheezing, no crackles.  Normal respiratory effort. No accessory muscle use.  Cardiovascular: Regular rate and rhythm, no murmurs / rubs / gallops. No extremity edema. 2+ pedal pulses. No carotid bruits.  Abdomen: L sided abd TTP, mostly LUQ and epigastric area.  No guarding, no rebound. Musculoskeletal: no clubbing / cyanosis. No joint deformity upper and lower extremities. Good ROM, no contractures. Normal muscle tone.  Skin: no rashes, lesions, ulcers. No induration Neurologic: CN 2-12 grossly intact. Sensation intact, DTR normal.  Strength 5/5 in all 4.  Psychiatric: Normal judgment and insight. Alert and oriented x 3. Normal mood.   Data Reviewed:       Latest Ref Rng & Units 02/05/2022    9:40 PM 02/04/2022   12:46 PM 06/12/2018    1:27 PM  CBC  WBC 4.0 - 10.5 K/uL 9.5  6.2  5.8   Hemoglobin 12.0 - 15.0 g/dL 11.2  11.6  13.2   Hematocrit 36.0 - 46.0 % 36.6  37.3  41.6   Platelets 150 - 400 K/uL 349  370  290       Latest Ref Rng & Units 02/05/2022    9:40 PM 02/04/2022    2:06 PM 02/04/2022   12:46 PM  CMP  Glucose 70 - 99 mg/dL 102   95   BUN 6 - 20 mg/dL 10   11   Creatinine 0.44 - 1.00 mg/dL 1.15   1.01   Sodium 135 - 145 mmol/L 136   140   Potassium 3.5 - 5.1 mmol/L 3.9   4.0   Chloride 98 - 111 mmol/L 104   103   CO2 22 - 32 mmol/L 21   26   Calcium 8.9 - 10.3 mg/dL 9.0   9.7   Total Protein 6.5 - 8.1 g/dL 7.9  8.1    Total Bilirubin 0.3 - 1.2 mg/dL 0.4  0.5    Alkaline Phos 38 - 126 U/L 50  48    AST 15 - 41 U/L 13  20    ALT 0 - 44 U/L 12  10     Triglycerides = 50  CT AP: IMPRESSION: 1. Mild diffuse pancreatic edema and peripancreatic soft tissue stranding compatible with acute pancreatitis. No evidence for pancreatic necrosis or pseudocyst formation 2. The appendix is visualized and appears mildly thickened measuring 8 mm. The wall of the appendix is mildly thickened measuring 3 mm. No periappendiceal fat stranding or free fluid. Cannot exclude early acute appendicitis. Clinical correlation advised. 3. Multiple uterine fibroids.  RUQ [place] = normal  Lipase 59  Assessment and Plan: * Acute pancreatitis Demonstrated on CT. No EtOH use recently to have caused W/u negative for gallstones at this point. Triglycerides normal ? Viral acute pancreatitis -> a rare cause but it is interesting that she is second patient I've admitted tonight with nearly identical presentation of URI symptoms followed by GI symptoms and acute pancreatitis that doesn't appear to be explained by the more common causes  (EtOH, gallstones, nor triglycerides). NPO IVF Dilaudid PRN pain Zofran PRN nausea Check RVP Check Coxsackie B antibodies  Obesity, Class III, BMI 40-49.9 (morbid obesity) ([place]) Consider zepbound or wegovy if insurance will cover as outpt during follow up.      Advance Care Planning:   Code Status: Full Code  Consults: None  Family Communication: Family at bedside  Severity of Illness: The appropriate patient status for this patient is OBSERVATION. Observation status is judged to be reasonable and necessary in order to provide the required  intensity of service to ensure the patient's safety. The patient's presenting symptoms, physical exam findings, and initial radiographic and laboratory data in the context of their medical condition is felt to place them at decreased risk for further clinical deterioration. Furthermore, it is anticipated that the patient will be medically stable for discharge from the hospital within 2 midnights of admission.   Author: Etta Quill., DO 02/06/2022 3:56 AM  For on call review [email or web address].

## 2022-02-06 NOTE — Assessment & Plan Note (Signed)
Consider zepbound or wegovy if insurance will cover as outpt during follow up.

## 2022-02-06 NOTE — Progress Notes (Signed)
Acute pancreatitis on unclear etiology, continue to have ab pain, not able to tolerate clears, continue ivf, prn analgesics  Significant other at bedside

## 2022-02-06 NOTE — Assessment & Plan Note (Signed)
Lipase elevated, and CT findings of pancreatitis.  Trigs normal.  LFTs normal and no gallstones visualized on imaging.  Denies EtOH.  Had a prior episode in 2014.  IgG4 and ANA normal.  Coxsackie virus Abs positive, not sure the significance of that titer.    Still having pain with any oral intake.  LFTs nromal. - IVF - NPO - Continue antiemetics and analgesics

## 2022-02-06 NOTE — Progress Notes (Signed)
  Transition of Care [facility]) Screening Note   Patient Details  Name: Laura Greene Date of Birth: Jun 26, 1990   Transition of Care [facility]) CM/SW Contact:    Henrietta Dine, RN Phone Number: 02/06/2022, 1:15 PM    Transition of Care Department [facility]) has reviewed patient and no TOC needs have been identified at this time. We will continue to monitor patient advancement through interdisciplinary progression rounds. If new patient transition needs arise, please place a TOC consult.

## 2022-02-07 DIAGNOSIS — K859 Acute pancreatitis without necrosis or infection, unspecified: Secondary | ICD-10-CM | POA: Diagnosis not present

## 2022-02-07 DIAGNOSIS — D509 Iron deficiency anemia, unspecified: Secondary | ICD-10-CM | POA: Diagnosis not present

## 2022-02-07 LAB — CBC WITH DIFFERENTIAL/PLATELET
Abs Immature Granulocytes: 0.02 10*3/uL (ref 0.00–0.07)
Basophils Absolute: 0 10*3/uL (ref 0.0–0.1)
Basophils Relative: 0 %
Eosinophils Absolute: 0 10*3/uL (ref 0.0–0.5)
Eosinophils Relative: 0 %
HCT: 32.2 % — ABNORMAL LOW (ref 36.0–46.0)
Hemoglobin: 9.9 g/dL — ABNORMAL LOW (ref 12.0–15.0)
Immature Granulocytes: 0 %
Lymphocytes Relative: 15 %
Lymphs Abs: 1.1 10*3/uL (ref 0.7–4.0)
MCH: 22.2 pg — ABNORMAL LOW (ref 26.0–34.0)
MCHC: 30.7 g/dL (ref 30.0–36.0)
MCV: 72.2 fL — ABNORMAL LOW (ref 80.0–100.0)
Monocytes Absolute: 0.4 10*3/uL (ref 0.1–1.0)
Monocytes Relative: 5 %
Neutro Abs: 5.8 10*3/uL (ref 1.7–7.7)
Neutrophils Relative %: 80 %
Platelets: 268 10*3/uL (ref 150–400)
RBC: 4.46 MIL/uL (ref 3.87–5.11)
RDW: 17.7 % — ABNORMAL HIGH (ref 11.5–15.5)
WBC: 7.3 10*3/uL (ref 4.0–10.5)
nRBC: 0 % (ref 0.0–0.2)

## 2022-02-07 LAB — BASIC METABOLIC PANEL
Anion gap: 7 (ref 5–15)
BUN: 9 mg/dL (ref 6–20)
CO2: 24 mmol/L (ref 22–32)
Calcium: 8.6 mg/dL — ABNORMAL LOW (ref 8.9–10.3)
Chloride: 105 mmol/L (ref 98–111)
Creatinine, Ser: 1.02 mg/dL — ABNORMAL HIGH (ref 0.44–1.00)
GFR, Estimated: >60 mL/min (ref >60)
Glucose, Bld: 96 mg/dL (ref 70–99)
Potassium: 3.9 mmol/L (ref 3.5–5.1)
Sodium: 136 mmol/L (ref 135–145)

## 2022-02-07 LAB — MAGNESIUM: Magnesium: 1.9 mg/dL (ref 1.7–2.4)

## 2022-02-07 LAB — LIPASE, BLOOD: Lipase: 73 U/L — ABNORMAL HIGH (ref 11–51)

## 2022-02-07 LAB — PHOSPHORUS: Phosphorus: 3.6 mg/dL (ref 2.5–4.6)

## 2022-02-07 MED ORDER — GUAIFENESIN ER 600 MG PO TB12
600.0000 mg | ORAL_TABLET | Freq: Two times a day (BID) | ORAL | Status: DC
  Administered 2022-02-07 – 2022-02-10 (×4): 600 mg via ORAL
  Filled 2022-02-07 (×8): qty 1

## 2022-02-07 NOTE — Progress Notes (Signed)
PROGRESS NOTE    [name]  SNK:[id number] DOB: 10/08/1990 DOA: 02/05/2022 PCP: Cari Caraway, MD     Brief Narrative:   Laura Greene is a 32 y.o. female with medical history significant of asthma, obesity, GERD.   Pt presents to ED with c/o abd pain in epigastric area.   Pt initially ill with URI symptoms starting 01/27/22.  Cough, congestion, runny nose.  Started to feel better from those symptoms, tried to eat a steak.  Developed GI symptoms around the 1st.  GI symptoms including N/V and epigastric and L sided abdominal pain have been persistent since onset.   Seen in ED yesterday, found to have acute pancreatitis, told to return if symptoms worsened.   Returned to ED today with persistent symptoms.   Notes that she still had episode of emesis at home. Denies chest pain, shortness of breath, urinary symptoms, fever.   Subjective:  Still have significant pain, no vomiting, no fever, lipase mildly elevated on presentation at 59, today went up to 73  Assessment & Plan:  Principal Problem:   Acute pancreatitis Active Problems:   Obesity, Class III, BMI 40-49.9 (morbid obesity) (HCC)    Assessment and Plan:   Acute pancreatitis of unknown etiology -CT ab/pel showed "Mild diffuse pancreatic edema and peripancreatic soft tissue stranding compatible with acute pancreatitis. No evidence for pancreatic necrosis or pseudocyst formation" -Denies  EtOH use recently  -Ct ab/pel and ab [place] negative for gallstones  -Triglycerides normal -? Viral acute pancreatitis , URI symptoms followed by GI symptoms and acute pancreatitis , Coxsackie B antibodies in process, respiratory viral panel negative,  ANA/IGg4 in process -continue to have pain, not able to tolerate oral intake, continue hydration, as needed analgesic, supportive care, trend lipase  Microcytic anemia - reports does have heavy period, each time last 4-5 days, use 4-5 pads a day, -currently having period -tbili wnl,  less likely hemolysis, will check iron panel/retic count -Monitor hgb  Obesity, Class III, BMI 40-49.9 (morbid obesity) ([place]) Life style modification Outpatient follow up with weight management    Other home medication, report has not used Adderall for 3 weeks, does not feel like she needs it right now.   I have Reviewed nursing notes, Vitals, pain scores, I/o's, Lab results and  imaging results since pt's last encounter, details please see discussion above  I ordered the following labs:  Unresulted Labs (From admission, onward)     Start     Ordered   02/08/22 9379  Basic metabolic panel  Tomorrow morning,   R        02/07/22 0854   02/08/22 0500  Lipase, blood  Tomorrow morning,   R        02/07/22 0854   02/08/22 0500  Iron and TIBC  Tomorrow morning,   R        02/07/22 1542   02/08/22 0500  Ferritin  Tomorrow morning,   R        02/07/22 1542   02/08/22 0500  Reticulocytes  Tomorrow morning,   R        02/07/22 1542   02/08/22 0500  CBC with Differential/Platelet  Tomorrow morning,   R        02/07/22 1556   02/07/22 0500  ANA, IFA (with reflex)  Tomorrow morning,   R        02/06/22 0843   02/07/22 0500  IgG 4  Tomorrow morning,   R  02/06/22 0843   02/06/22 0319  Coxsackie B virus antibodies  Once,   R        02/06/22 0318   Unscheduled  Occult blood card to lab, stool  As needed,   R      02/07/22 1542             DVT prophylaxis: Place and maintain sequential compression device Start: 02/07/22 1552   Code Status:   Code Status: Full Code  Family Communication: Significant other at bedside on 1/7 Disposition:   Dispo: The patient is from: Home              Anticipated d/c is to: Home              Anticipated d/c date is: . 24hrs, need to able to tolerate oral intake  Antimicrobials:    Anti-infectives (From admission, onward)    None          Objective: Vitals:   02/06/22 1429 02/06/22 2003 02/07/22 0523 02/07/22 1523  BP: 132/67  112/65 (!) 127/52 127/69  Pulse: 91 81 97 88  Resp: '16 18 18 18  '$ Temp: 97.6 F (36.4 C) 98.4 F (36.9 C) 98.8 F (37.1 C) 98 F (36.7 C)  TempSrc:      SpO2: 99% 96% 97% 97%  Weight:      Height:       No intake or output data in the 24 hours ending 02/07/22 1556  Filed Weights   02/05/22 2127 02/05/22 2131 02/06/22 0152  Weight: 108.9 kg 108 kg 123.8 kg    Examination:  General exam: alert, awake, communicative,calm, NAD Respiratory system: Clear to auscultation. Respiratory effort normal. Cardiovascular system:  RRR.  Gastrointestinal system: upper abdomen tender, no rebound, + bs . Central nervous system: Alert and oriented. No focal neurological deficits. Extremities:  no edema Skin: No rashes, lesions or ulcers Psychiatry: Judgement and insight appear normal. Mood & affect appropriate.     Data Reviewed: I have personally reviewed  labs and visualized  imaging studies since the last encounter and formulate the plan        Scheduled Meds:  escitalopram  20 mg Oral QHS   guaiFENesin  600 mg Oral BID   Continuous Infusions:  lactated ringers 125 mL/hr at 02/07/22 1409     LOS: 1 day   Time spent: 15mns,  FFlorencia Reasons MD PhD FACP [facility]  Available via Epic secure chat 7am-7pm for nonurgent issues Please page for urgent issues To page the attending provider between 7A-7P or the covering provider during after hours 7P-7A, please log into the web site [email or web address] and access using universal [place] password for that web site. If you do not have the password, please call the hospital operator.    02/07/2022, 3:56 PM

## 2022-02-08 DIAGNOSIS — D509 Iron deficiency anemia, unspecified: Secondary | ICD-10-CM | POA: Insufficient documentation

## 2022-02-08 DIAGNOSIS — K859 Acute pancreatitis without necrosis or infection, unspecified: Secondary | ICD-10-CM | POA: Diagnosis not present

## 2022-02-08 DIAGNOSIS — F419 Anxiety disorder, unspecified: Secondary | ICD-10-CM | POA: Insufficient documentation

## 2022-02-08 LAB — IRON AND TIBC
Iron: 26 ug/dL — ABNORMAL LOW (ref 28–170)
Saturation Ratios: 9 % — ABNORMAL LOW (ref 10.4–31.8)
TIBC: 286 ug/dL (ref 250–450)
UIBC: 260 ug/dL

## 2022-02-08 LAB — CBC WITH DIFFERENTIAL/PLATELET
Abs Immature Granulocytes: 0.02 10*3/uL (ref 0.00–0.07)
Basophils Absolute: 0 10*3/uL (ref 0.0–0.1)
Basophils Relative: 0 %
Eosinophils Absolute: 0 10*3/uL (ref 0.0–0.5)
Eosinophils Relative: 0 %
HCT: 30.3 % — ABNORMAL LOW (ref 36.0–46.0)
Hemoglobin: 9.4 g/dL — ABNORMAL LOW (ref 12.0–15.0)
Immature Granulocytes: 0 %
Lymphocytes Relative: 19 %
Lymphs Abs: 1.5 10*3/uL (ref 0.7–4.0)
MCH: 22.2 pg — ABNORMAL LOW (ref 26.0–34.0)
MCHC: 31 g/dL (ref 30.0–36.0)
MCV: 71.5 fL — ABNORMAL LOW (ref 80.0–100.0)
Monocytes Absolute: 0.4 10*3/uL (ref 0.1–1.0)
Monocytes Relative: 4 %
Neutro Abs: 6 10*3/uL (ref 1.7–7.7)
Neutrophils Relative %: 77 %
Platelets: 287 10*3/uL (ref 150–400)
RBC: 4.24 MIL/uL (ref 3.87–5.11)
RDW: 17.5 % — ABNORMAL HIGH (ref 11.5–15.5)
WBC: 7.9 10*3/uL (ref 4.0–10.5)
nRBC: 0 % (ref 0.0–0.2)

## 2022-02-08 LAB — BASIC METABOLIC PANEL
Anion gap: 9 (ref 5–15)
BUN: 8 mg/dL (ref 6–20)
CO2: 24 mmol/L (ref 22–32)
Calcium: 8.5 mg/dL — ABNORMAL LOW (ref 8.9–10.3)
Chloride: 102 mmol/L (ref 98–111)
Creatinine, Ser: 0.97 mg/dL (ref 0.44–1.00)
GFR, Estimated: >60 mL/min (ref >60)
Glucose, Bld: 81 mg/dL (ref 70–99)
Potassium: 3.7 mmol/L (ref 3.5–5.1)
Sodium: 135 mmol/L (ref 135–145)

## 2022-02-08 LAB — LIPASE, BLOOD: Lipase: 44 U/L (ref 11–51)

## 2022-02-08 LAB — IGG 4: IgG, Subclass 4: 45 mg/dL (ref 2–96)

## 2022-02-08 LAB — COXSACKIE B VIRUS ANTIBODIES
Coxsackie B1 Ab: 1:100 {titer} — ABNORMAL HIGH
Coxsackie B2 Ab: 1:500 {titer} — ABNORMAL HIGH
Coxsackie B3 Ab: 1:500 {titer} — ABNORMAL HIGH
Coxsackie B4 Ab: 1:500 {titer} — ABNORMAL HIGH
Coxsackie B5 Ab: 1:500 {titer} — ABNORMAL HIGH
Coxsackie B6 Ab: 1:100 {titer} — ABNORMAL HIGH

## 2022-02-08 LAB — ANTINUCLEAR ANTIBODIES, IFA: ANA Ab, IFA: NEGATIVE

## 2022-02-08 LAB — FERRITIN: Ferritin: 38 ng/mL (ref 11–307)

## 2022-02-08 LAB — RETICULOCYTES
Immature Retic Fract: 20.6 % — ABNORMAL HIGH (ref 2.3–15.9)
RBC.: 4.24 MIL/uL (ref 3.87–5.11)
Retic Count, Absolute: 51.7 10*3/uL (ref 19.0–186.0)
Retic Ct Pct: 1.2 % (ref 0.4–3.1)

## 2022-02-08 MED ORDER — PROCHLORPERAZINE EDISYLATE 10 MG/2ML IJ SOLN
5.0000 mg | Freq: Once | INTRAMUSCULAR | Status: AC
  Administered 2022-02-08: 5 mg via INTRAVENOUS
  Filled 2022-02-08: qty 2

## 2022-02-08 MED ORDER — ALUM & MAG HYDROXIDE-SIMETH 200-200-20 MG/5ML PO SUSP
15.0000 mL | ORAL | Status: DC | PRN
  Administered 2022-02-09: 15 mL via ORAL
  Filled 2022-02-08 (×2): qty 30

## 2022-02-08 MED ORDER — SIMETHICONE 80 MG PO CHEW
80.0000 mg | CHEWABLE_TABLET | Freq: Once | ORAL | Status: AC
  Administered 2022-02-08: 80 mg via ORAL
  Filled 2022-02-08: qty 1

## 2022-02-08 MED ORDER — SODIUM CHLORIDE 0.9 % IV SOLN
125.0000 mg | Freq: Every day | INTRAVENOUS | Status: AC
  Administered 2022-02-08 – 2022-02-09 (×2): 125 mg via INTRAVENOUS
  Filled 2022-02-08 (×2): qty 10

## 2022-02-08 NOTE — Assessment & Plan Note (Signed)
Continue Lexapro

## 2022-02-08 NOTE — Assessment & Plan Note (Addendum)
Iron sat low.   - Start IV iron

## 2022-02-08 NOTE — Hospital Course (Signed)
Ms Elrod is a 32 y.o. F with morbid obesity BMI 45 and pancreatitis in 2014 who presented with few days nausea and acute epigastric pain.  In the ER, CT showed pancreatitis, lipase elevated.

## 2022-02-08 NOTE — Progress Notes (Signed)
  Progress Note   Patient: Laura Greene [id number]:[id number] DOB: [birth year] DOA: 02/05/2022     2 DOS: the patient was seen and examined on 02/08/2022 a 9:55AM      Brief hospital course: Laura Greene is a 32 y.o. F with morbid obesity BMI 45 and pancreatitis in 2014 who presented with few days nausea and acute epigastric pain.  In the ER, CT showed pancreatitis, lipase elevated.     Assessment and Plan: * Acute pancreatitis Lipase elevated, and CT findings of pancreatitis.  Trigs normal.  LFTs normal and no gallstones visualized on imaging.  Denies EtOH.  Had a prior episode in 2014.  IgG4 and ANA normal.  Coxsackie virus Abs positive, not sure the significance of that titer.    Severe pain last night with clears - IVF - NPO - Continue antiemetics and analgesics    Obesity, Class III, BMI 40-49.9 (morbid obesity) (HCC) BMI 45  Anxiety - Continue Lexapro  Iron deficiency anemia Iron sat low.   - Start IV iron          Subjective: Patient had severe pain last night after drinking some water.  She has some soreness in the epigastrium and the right upper quadrant today.  No fever, confusion, respiratory symptoms.     Physical Exam: BP (!) 142/75 (BP Location: Right Arm)   Pulse 76   Temp 98.9 F (37.2 C) (Oral)   Resp 15   Ht '5\' 5"'$  (1.651 m)   Wt 123.8 kg   LMP 01/13/2022   SpO2 96%   BMI 45.43 kg/m   Obese adult female, lying in bed, no acute distress RRR, no murmur, no peripheral edema Abdomen is Mild Soreness in the Epigastrium, but Overall Abdomen Soft and She Is Not Guarding Attention Normal, Affect Appropriate, Judgment Insight Appear Normal    Data Reviewed: Iron saturation low Basic metabolic panel unremarkable Lipase resulted normal  Family Communication: None present    Disposition: Status is: Inpatient Patient is still n.p.o., will need ongoing IV fluids, advance diet tomorrow        Author: Edwin Dada, MD 02/08/2022  2:08 PM  For on call review [email or web address].

## 2022-02-09 DIAGNOSIS — D509 Iron deficiency anemia, unspecified: Secondary | ICD-10-CM | POA: Diagnosis not present

## 2022-02-09 DIAGNOSIS — K859 Acute pancreatitis without necrosis or infection, unspecified: Secondary | ICD-10-CM | POA: Diagnosis not present

## 2022-02-09 DIAGNOSIS — F419 Anxiety disorder, unspecified: Secondary | ICD-10-CM | POA: Diagnosis not present

## 2022-02-09 LAB — HEPATIC FUNCTION PANEL
ALT: 10 U/L (ref 0–44)
AST: 12 U/L — ABNORMAL LOW (ref 15–41)
Albumin: 3.2 g/dL — ABNORMAL LOW (ref 3.5–5.0)
Alkaline Phosphatase: 44 U/L (ref 38–126)
Bilirubin, Direct: <0.1 mg/dL (ref 0.0–0.2)
Total Bilirubin: 0.7 mg/dL (ref 0.3–1.2)
Total Protein: 6.9 g/dL (ref 6.5–8.1)

## 2022-02-09 LAB — BASIC METABOLIC PANEL
Anion gap: 8 (ref 5–15)
BUN: 7 mg/dL (ref 6–20)
CO2: 25 mmol/L (ref 22–32)
Calcium: 8.4 mg/dL — ABNORMAL LOW (ref 8.9–10.3)
Chloride: 102 mmol/L (ref 98–111)
Creatinine, Ser: 0.92 mg/dL (ref 0.44–1.00)
GFR, Estimated: >60 mL/min (ref >60)
Glucose, Bld: 95 mg/dL (ref 70–99)
Potassium: 3.7 mmol/L (ref 3.5–5.1)
Sodium: 135 mmol/L (ref 135–145)

## 2022-02-09 LAB — CBC
HCT: 29.5 % — ABNORMAL LOW (ref 36.0–46.0)
Hemoglobin: 9.3 g/dL — ABNORMAL LOW (ref 12.0–15.0)
MCH: 22.7 pg — ABNORMAL LOW (ref 26.0–34.0)
MCHC: 31.5 g/dL (ref 30.0–36.0)
MCV: 72 fL — ABNORMAL LOW (ref 80.0–100.0)
Platelets: 282 10*3/uL (ref 150–400)
RBC: 4.1 MIL/uL (ref 3.87–5.11)
RDW: 17.2 % — ABNORMAL HIGH (ref 11.5–15.5)
WBC: 7.2 10*3/uL (ref 4.0–10.5)
nRBC: 0 % (ref 0.0–0.2)

## 2022-02-09 MED ORDER — POLYETHYLENE GLYCOL 3350 17 G PO PACK: 17.0000 g | PACK | Freq: Two times a day (BID) | ORAL | Status: DC | PRN

## 2022-02-09 MED ORDER — OXYCODONE HCL 5 MG PO TABS
5.0000 mg | ORAL_TABLET | Freq: Four times a day (QID) | ORAL | Status: DC | PRN
  Administered 2022-02-09 – 2022-02-10 (×3): 5 mg via ORAL
  Filled 2022-02-09 (×3): qty 1

## 2022-02-09 MED ORDER — ALUM & MAG HYDROXIDE-SIMETH 200-200-20 MG/5ML PO SUSP
30.0000 mL | ORAL | Status: DC | PRN
  Administered 2022-02-09 (×2): 30 mL via ORAL
  Filled 2022-02-09 (×3): qty 30

## 2022-02-09 MED ORDER — SENNOSIDES-DOCUSATE SODIUM 8.6-50 MG PO TABS
1.0000 | ORAL_TABLET | Freq: Two times a day (BID) | ORAL | Status: DC | PRN
  Administered 2022-02-10: 1 via ORAL
  Filled 2022-02-09 (×2): qty 1

## 2022-02-09 MED ORDER — FAMOTIDINE 20 MG PO TABS
20.0000 mg | ORAL_TABLET | Freq: Two times a day (BID) | ORAL | Status: DC | PRN
  Administered 2022-02-09: 20 mg via ORAL
  Filled 2022-02-09: qty 1

## 2022-02-09 NOTE — Progress Notes (Signed)
  Progress Note   Patient: Laura Greene QZR:[id number] DOB: 1990-03-21 DOA: 02/05/2022     3 DOS: the patient was seen and examined on 02/09/2022 at 8:35 AM      Brief hospital course: Laura Greene is a 32 y.o. F with morbid obesity BMI 45 and pancreatitis in 2014 who presented with few days nausea and acute epigastric pain.  In the ER, CT showed pancreatitis, lipase elevated.     Assessment and Plan: * Acute pancreatitis Lipase elevated, and CT findings of pancreatitis.  Trigs normal.  LFTs normal and no gallstones visualized on imaging.  Denies EtOH.  Had a prior episode in 2014.  IgG4 and ANA normal.  Coxsackie virus Abs positive, not sure the significance of that titer.    Still having pain with any oral intake.  LFTs nromal. - IVF - NPO - Continue antiemetics and analgesics    Obesity, Class III, BMI 40-49.9 (morbid obesity) (HCC) BMI 45  Anxiety - Continue Lexapro  Iron deficiency anemia Iron sat low.   - Contnue IV iron - Follow upw with PCP and OB-Gyn          Subjective: Patient had a lot of pain overnight, this sounds actually more like reflux pain, not exactly epigastric pain with nausea but, more heartburn, relieved with Maalox.  No fever.  No new abdominal pain.     Physical Exam: BP 133/76 (BP Location: Left Arm)   Pulse 76   Temp 98.6 F (37 C)   Resp 17   Ht '5\' 5"'$  (1.651 m)   Wt 123.8 kg   LMP 01/13/2022   SpO2 97%   BMI 45.43 kg/m   Obese adult female, lying in bed, interactive and appropriate, sits up comfortably,, no discomfort with mobilization RRR, no murmurs, no peripheral edema Respiratory rate normal, lungs clear without rales or wheezes Abdomen with mild tenderness in the epigastrium, soft, no guarding, no rebound, no distention Attention normal, affect appropriate, judgment insight appear normal    Data Reviewed: Comprehensive metabolic panel normal Hemoglobin 9, no change from yesterday    Family Communication: None  present    Disposition: Status is: Inpatient Patient requires ongoing IV fluids, will try to advance diet today, hopefully home within the next 24 to 48 hours          Author: Edwin Dada, MD 02/09/2022 10:45 AM  For on call review [email or web address].

## 2022-02-10 DIAGNOSIS — K859 Acute pancreatitis without necrosis or infection, unspecified: Secondary | ICD-10-CM | POA: Diagnosis not present

## 2022-02-10 LAB — CBC
HCT: 30.1 % — ABNORMAL LOW (ref 36.0–46.0)
Hemoglobin: 9.3 g/dL — ABNORMAL LOW (ref 12.0–15.0)
MCH: 22.5 pg — ABNORMAL LOW (ref 26.0–34.0)
MCHC: 30.9 g/dL (ref 30.0–36.0)
MCV: 72.7 fL — ABNORMAL LOW (ref 80.0–100.0)
Platelets: 295 10*3/uL (ref 150–400)
RBC: 4.14 MIL/uL (ref 3.87–5.11)
RDW: 17.9 % — ABNORMAL HIGH (ref 11.5–15.5)
WBC: 5.5 10*3/uL (ref 4.0–10.5)
nRBC: 0 % (ref 0.0–0.2)

## 2022-02-10 LAB — COMPREHENSIVE METABOLIC PANEL
ALT: 10 U/L (ref 0–44)
AST: 11 U/L — ABNORMAL LOW (ref 15–41)
Albumin: 3.5 g/dL (ref 3.5–5.0)
Alkaline Phosphatase: 40 U/L (ref 38–126)
Anion gap: 9 (ref 5–15)
BUN: 7 mg/dL (ref 6–20)
CO2: 26 mmol/L (ref 22–32)
Calcium: 8.4 mg/dL — ABNORMAL LOW (ref 8.9–10.3)
Chloride: 102 mmol/L (ref 98–111)
Creatinine, Ser: 0.93 mg/dL (ref 0.44–1.00)
GFR, Estimated: >60 mL/min (ref >60)
Glucose, Bld: 97 mg/dL (ref 70–99)
Potassium: 3.7 mmol/L (ref 3.5–5.1)
Sodium: 137 mmol/L (ref 135–145)
Total Bilirubin: 0.6 mg/dL (ref 0.3–1.2)
Total Protein: 6.3 g/dL — ABNORMAL LOW (ref 6.5–8.1)

## 2022-02-10 MED ORDER — CEFADROXIL 500 MG PO CAPS: 500.0000 mg | ORAL_CAPSULE | Freq: Two times a day (BID) | ORAL | 0 refills | Status: AC

## 2022-02-10 NOTE — Progress Notes (Signed)
Mobility Specialist - Progress Note   02/10/22 1325  Mobility  Activity Ambulated with assistance in hallway  Level of Assistance Modified independent, requires aide device or extra time  Assistive Device None  Distance Ambulated (ft) 350 ft  Activity Response Tolerated well  Mobility Referral Yes  $Mobility charge 1 Mobility   Pt received in bed and agreed to mobility, had no c/o pain nor discomfort. Pt returned to bed with all needs met, req another session tomorrow.   Roderick Pee Mobility Specialist

## 2022-02-10 NOTE — Plan of Care (Signed)

## 2022-02-10 NOTE — Discharge Summary (Signed)
Physician Discharge Summary   Patient: Laura Greene MRN: [id number] DOB: 1990-11-03  Admit date:     02/05/2022  Discharge date: 02/10/22  Discharge Physician: Edwin Dada   PCP: Cari Caraway, MD     Recommendations at discharge:  Follow up with PCP Dr. Addison Lank Dr. Addison Lank: Please check LFTs in 1 week and follow up abdominal pain If post-prandial pain continues, recommend work up for biliary colic and Gen Surg referral If pain more heartburn-like, please treat as appropriate Please follow up iron deficiency anemia       Discharge Diagnoses: Principal Problem:   Acute pancreatitis Active Problems:   Obesity, Class III, BMI 45 (morbid obesity)   Iron deficiency anemia   Anxiety   Phlebitis       Hospital Course: Laura Greene is a 32 y.o. F with morbid obesity BMI 45 and pancreatitis in 2014 who presented with few days nausea and acute epigastric pain.  In the ER, CT showed pancreatitis, lipase elevated.     * Acute pancreatitis Lipase elevated, and CT findings of pancreatitis.    Denied EtOH use.  Trigs normal.  LFTs remained completely normal and no gallstones visualized on imaging, doubt gallstone pancreatitis.  Had a prior episode in 2014.  IgG4 and ANA normal.  Coxsackie virus Abs positive, not sure the significance of that titer.    The patient was treated with IV fluids and bowel rest.    She clinically improved, but had notable pain with re-introduction of oral intake. Eventually, it seemed that this pain was primarily immediately with swallowing, suggesting it was more due to esophagitis or gastritis, and it improved with Maalox.  Recommend PCP follow up as above.         Anxiety Stable on Lexapro  Iron deficiency anemia Iron sat low.    GIven IV iron here.    Recommend follow up Hgb and iron studies in few months.  Recommend initiating oral iron after resolution of abdominal discomfort.     Phlebitis Has a left AC joint  redness and swelling.  Suspect this may be developing a mild septic thrombophlebitis.  Recommend cefadroxil and hot compresses and PCP follow up.    Flank pain Had some back pain getting out of shower yesterday.  This was a hyperesthesia.  No rash to suggest Zoster.  Defer to PCP.         [facility] Controlled Substances Registry was reviewed for this patient prior to discharge.   Consultants: None Procedures performed: CT abdomen  Disposition: Home Diet recommendation:  BLand  DISCHARGE MEDICATION: Allergies as of 02/10/2022       Reactions   Vyvanse [lisdexamfetamine] Palpitations, Other (See Comments)   At a higher dose, this makes the patient's heart race        Medication List     TAKE these medications    amphetamine-dextroamphetamine 20 MG 24 hr capsule Commonly known as: ADDERALL XR Take 20 mg by mouth daily.   cefadroxil 500 MG capsule Commonly known as: DURICEF Take 1 capsule (500 mg total) by mouth 2 (two) times daily.   escitalopram 20 MG tablet Commonly known as: LEXAPRO Take 20 mg by mouth at bedtime.   fluticasone 50 MCG/ACT nasal spray Commonly known as: FLONASE Place 1 spray into both nostrils daily as needed for allergies.   ondansetron 4 MG disintegrating tablet Commonly known as: ZOFRAN-ODT Take 1 tablet (4 mg total) by mouth every 8 (eight) hours as needed for nausea or vomiting.  Follow-up Information     Cari Caraway, MD Follow up.   Specialty: Family Medicine Why: Go see in one week for hospital follow up, get labs Contact information: [place] [id number] [phone]                 Discharge Instructions     Discharge instructions   Complete by: As directed    **IMPORTANT DISCHARGE INSTRUCTIONS**   From Dr. Loleta Books: You were admitted for pancreatitis Here, we did enzyme testing and found your lipase level was elevated We also did a CT scan of your abdomen, and found  that your pancreas was inflamed  You were treated with fluids and bowel rest and I suspect your pancreatitis is improved. I will forward this information to Dr. Addison Lank, but we did do testing for gallstones, IgG4-related pancreatitis, triglyceride related pancreatitis and all of these tests were normal.  You probably had "idiopathic" pancreatitis, which is common.  Eat a bland diet: bananas, rice, toast, applesauce, pudding, cereal, crackers, graham crackers, soups Avoid fatty foods, or high protein foods for the next few days, gradually increase your diet  For nausea, you may take ondansetron up to every 8 hours If taking that often, call your doctor Dr. Addison Lank to let her know  For mild pain, take acetaminophen or ibuprofen For severe pain, vomiting that won't stop, or passing out --> return to the hospital   Go see Dr. Addison Lank in 1 week and get labs  Have her follow your iron levels  For the phlebitis from the IV in your left forearm, take cefadroxil 500 mg twice daily for 4 days  If the spot on your back that was hurting develops a rash, look up pictures of "Zoster" and take a picture of the rash and call Dr. Baldomero Lamy office   For constipation, take something gentle like docusate 100 mg twice daily For heart burn, take something like famotidine/Pepcid 10-20 mg or Maalox/Mylanta   Increase activity slowly   Complete by: As directed        Discharge Exam: Filed Weights   02/05/22 2127 02/05/22 2131 02/06/22 0152  Weight: 108.9 kg 108 kg 123.8 kg    General: Pt is alert, awake, not in acute distress Cardiovascular: RRR, nl S1-S2, no murmurs appreciated.   No LE edema.   Respiratory: Normal respiratory rate and rhythm.  CTAB without rales or wheezes. Abdominal: Abdomen soft and non-tender.  No distension or HSM.   Neuro/Psych: Strength symmetric in upper and lower extremities.  Judgment and insight appear normal.   Condition at discharge: good  The results of  significant diagnostics from this hospitalization (including imaging, microbiology, ancillary and laboratory) are listed below for reference.   Imaging Studies: US Abdomen Limited RUQ (LIVER/GB)  Result Date: 02/06/2022 CLINICAL DATA:  Acute pancreatitis. EXAM: ULTRASOUND ABDOMEN LIMITED RIGHT UPPER QUADRANT COMPARISON:  None Available. FINDINGS: Gallbladder: No gallstones or wall thickening visualized (2.0 mm). No sonographic Murphy sign noted by sonographer. Common bile duct: Diameter: 6.1 mm Liver: No focal lesion identified. Within normal limits in parenchymal echogenicity. Portal vein is patent on color Doppler imaging with normal direction of blood flow towards the liver. Other: Limited study secondary to the patient's body habitus. IMPRESSION: Normal right upper quadrant ultrasound. Electronically Signed   By: Virgina Norfolk M.D.   On: 02/06/2022 01:13   CT ABDOMEN PELVIS W CONTRAST  Result Date: 02/04/2022 CLINICAL DATA:  Abdominal pain.  Acute.  Vomiting. EXAM: CT ABDOMEN AND PELVIS  WITH CONTRAST TECHNIQUE: Multidetector CT imaging of the abdomen and pelvis was performed using the standard protocol following bolus administration of intravenous contrast. RADIATION DOSE REDUCTION: This exam was performed according to the departmental dose-optimization program which includes automated exposure control, adjustment of the mA and/or kV according to patient size and/or use of iterative reconstruction technique. CONTRAST:  171m OMNIPAQUE IOHEXOL 300 MG/ML  SOLN COMPARISON:  04/18/2012 FINDINGS: Lower chest: No pleural fluid or airspace disease. Hepatobiliary: No focal liver abnormality is seen. No gallstones, gallbladder wall thickening, or biliary dilatation. Common bile duct is upper limits of normal measuring 6 mm. Pancreas: There is mild diffuse pancreatic edema and peripancreatic soft tissue stranding. No signs of main duct dilatation. No mass or abnormal fluid collections. No evidence for  pancreatic necrosis. Spleen: Normal in size without focal abnormality. Adrenals/Urinary Tract: Normal adrenal glands. The kidneys are unremarkable. No nephrolithiasis, hydronephrosis, or mass. Urinary bladder is unremarkable. Stomach/Bowel: Stomach appears normal. The appendix is visualized and appears mildly thickened measuring 8 mm. The wall of the appendix is mildly thickened measuring 3 mm, image 66/2. No periappendiceal fat stranding or free fluid. No bowel wall thickening, inflammation or distension. Vascular/Lymphatic: No significant vascular findings are present. No enlarged abdominal or pelvic lymph nodes. Reproductive: Multiple uterine fibroids are identified. The largest is in the right side of uterus measuring 4.8 x 3.6 cm, image 71/2. No adnexal mass. Other: No significant free fluid or fluid collections. Musculoskeletal: No acute or significant osseous findings. IMPRESSION: 1. Mild diffuse pancreatic edema and peripancreatic soft tissue stranding compatible with acute pancreatitis. No evidence for pancreatic necrosis or pseudocyst formation 2. The appendix is visualized and appears mildly thickened measuring 8 mm. The wall of the appendix is mildly thickened measuring 3 mm. No periappendiceal fat stranding or free fluid. Cannot exclude early acute appendicitis. Clinical correlation advised. 3. Multiple uterine fibroids. Electronically Signed   By: TKerby MoorsM.D.   On: 02/04/2022 17:32   DG Chest 2 View  Result Date: 02/04/2022 CLINICAL DATA:  Chest wall pain EXAM: CHEST - 2 VIEW COMPARISON:  Chest x-ray January 26, 2022 FINDINGS: The heart size and mediastinal contours are within normal limits. Low lung volumes with bronchovascular crowding. No focal pulmonary opacity. No pleural effusion or pneumothorax. The visualized upper abdomen is unremarkable. No acute osseous abnormality. IMPRESSION: No acute cardiopulmonary abnormality. Electronically Signed   By: MBeryle FlockM.D.   On:  02/04/2022 12:35   DG Chest 2 View  Result Date: 01/27/2022 CLINICAL DATA:  Fever, cough, fatigue EXAM: CHEST - 2 VIEW COMPARISON:  None Available. FINDINGS: The heart size and mediastinal contours are within normal limits. Both lungs are clear. The visualized skeletal structures are unremarkable. IMPRESSION: No active cardiopulmonary disease. Electronically Signed   By: MRanda NgoM.D.   On: 01/27/2022 21:01    Microbiology: Results for orders placed or performed during the hospital encounter of 02/05/22  Respiratory (~20 pathogens) panel by PCR     Status: None   Collection Time: 02/06/22  3:29 AM   Specimen: Nasopharyngeal Swab; Respiratory  Result Value Ref Range Status   Adenovirus NOT DETECTED NOT DETECTED Final   Coronavirus 229E NOT DETECTED NOT DETECTED Final    Comment: (NOTE) The Coronavirus on the Respiratory Panel, DOES NOT test for the novel  Coronavirus (2019 nCoV)    Coronavirus HKU1 NOT DETECTED NOT DETECTED Final   Coronavirus NL63 NOT DETECTED NOT DETECTED Final   Coronavirus OC43 NOT DETECTED NOT DETECTED Final  Metapneumovirus NOT DETECTED NOT DETECTED Final   Rhinovirus / Enterovirus NOT DETECTED NOT DETECTED Final   Influenza A NOT DETECTED NOT DETECTED Final   Influenza B NOT DETECTED NOT DETECTED Final   Parainfluenza Virus 1 NOT DETECTED NOT DETECTED Final   Parainfluenza Virus 2 NOT DETECTED NOT DETECTED Final   Parainfluenza Virus 3 NOT DETECTED NOT DETECTED Final   Parainfluenza Virus 4 NOT DETECTED NOT DETECTED Final   Respiratory Syncytial Virus NOT DETECTED NOT DETECTED Final   Bordetella pertussis NOT DETECTED NOT DETECTED Final   Bordetella Parapertussis NOT DETECTED NOT DETECTED Final   Chlamydophila pneumoniae NOT DETECTED NOT DETECTED Final   Mycoplasma pneumoniae NOT DETECTED NOT DETECTED Final    Comment: Performed at [facility], [place] [address] [place], [place] [address]    Labs: CBC: Recent Labs  Lab 02/04/22 1246  02/05/22 2140 02/06/22 0454 02/07/22 0403 02/08/22 0404 02/09/22 0358 02/10/22 0401  WBC 6.2   < > 7.1 7.3 7.9 7.2 5.5  NEUTROABS 4.1  --   --  5.8 6.0  --   --   HGB 11.6*   < > 9.7* 9.9* 9.4* 9.3* 9.3*  HCT 37.3   < > 31.6* 32.2* 30.3* 29.5* 30.1*  MCV 71.9*   < > 72.3* 72.2* 71.5* 72.0* 72.7*  PLT 370   < > 296 268 287 282 295   < > = values in this interval not displayed.   Basic Metabolic Panel: Recent Labs  Lab 02/06/22 0454 02/07/22 0403 02/08/22 0404 02/09/22 0358 02/10/22 0401  NA 136 136 135 135 137  K 3.8 3.9 3.7 3.7 3.7  CL 107 105 102 102 102  CO2 '22 24 24 25 26  '$ GLUCOSE 96 96 81 95 97  BUN '10 9 8 7 7  '$ CREATININE 0.97 1.02* 0.97 0.92 0.93  CALCIUM 8.3* 8.6* 8.5* 8.4* 8.4*  MG  --  1.9  --   --   --   PHOS  --  3.6  --   --   --    Liver Function Tests: Recent Labs  Lab 02/04/22 1406 02/05/22 2140 02/06/22 0454 02/09/22 0358 02/10/22 0401  AST 20 13* 9* 12* 11*  ALT '10 12 11 10 10  '$ ALKPHOS 48 50 43 44 40  BILITOT 0.5 0.4 0.5 0.7 0.6  PROT 8.1 7.9 6.6 6.9 6.3*  ALBUMIN 4.2 3.7 3.1* 3.2* 3.5   CBG: No results for input(s): "GLUCAP" in the last 168 hours.  Discharge time spent: approximately 45 minutes spent on discharge counseling, evaluation of patient on day of discharge, and coordination of discharge planning with nursing, social work, pharmacy and case management  Signed: Edwin Dada, MD [facility] 02/10/2022
